# Patient Record
Sex: Female | Born: 1960 | Race: White | Hispanic: No | Smoking: Current every day smoker
Health system: Southern US, Community
[De-identification: ages and names within clinical notes are randomized; demographics above are authoritative.]

## PROBLEM LIST (undated history)

## (undated) DIAGNOSIS — F419 Anxiety disorder, unspecified: Secondary | ICD-10-CM

## (undated) DIAGNOSIS — G4730 Sleep apnea, unspecified: Secondary | ICD-10-CM

## (undated) DIAGNOSIS — F17200 Nicotine dependence, unspecified, uncomplicated: Secondary | ICD-10-CM

## (undated) DIAGNOSIS — E785 Hyperlipidemia, unspecified: Secondary | ICD-10-CM

## (undated) DIAGNOSIS — F32A Depression, unspecified: Secondary | ICD-10-CM

## (undated) DIAGNOSIS — M545 Other chronic pain: Secondary | ICD-10-CM

## (undated) DIAGNOSIS — K589 Irritable bowel syndrome without diarrhea: Secondary | ICD-10-CM

## (undated) DIAGNOSIS — K219 Gastro-esophageal reflux disease without esophagitis: Secondary | ICD-10-CM

## (undated) DIAGNOSIS — G43909 Migraine, unspecified, not intractable, without status migrainosus: Secondary | ICD-10-CM

## (undated) DIAGNOSIS — G8929 Other chronic pain: Secondary | ICD-10-CM

## (undated) DIAGNOSIS — F329 Major depressive disorder, single episode, unspecified: Secondary | ICD-10-CM

## (undated) HISTORY — DX: Other chronic pain: G89.29

## (undated) HISTORY — PX: PARTIAL HYSTERECTOMY: SHX80

## (undated) HISTORY — DX: Other chronic pain: M54.50

## (undated) HISTORY — PX: ABDOMINAL HYSTERECTOMY: SHX81

## (undated) HISTORY — DX: Low back pain: M54.5

## (undated) HISTORY — PX: TUBAL LIGATION: SHX77

---

## 1998-10-01 ENCOUNTER — Encounter: Payer: Self-pay | Admitting: Family Medicine

## 1998-10-01 ENCOUNTER — Ambulatory Visit (HOSPITAL_COMMUNITY): Admission: RE | Admit: 1998-10-01 | Discharge: 1998-10-01 | Payer: Self-pay | Admitting: Family Medicine

## 1998-11-03 ENCOUNTER — Ambulatory Visit (HOSPITAL_COMMUNITY): Admission: RE | Admit: 1998-11-03 | Discharge: 1998-11-03 | Payer: Self-pay | Admitting: Family Medicine

## 1998-11-03 ENCOUNTER — Encounter: Payer: Self-pay | Admitting: Family Medicine

## 1998-11-28 ENCOUNTER — Inpatient Hospital Stay (HOSPITAL_COMMUNITY): Admission: RE | Admit: 1998-11-28 | Discharge: 1998-11-29 | Payer: Self-pay | Admitting: Neurosurgery

## 1998-11-28 ENCOUNTER — Encounter: Payer: Self-pay | Admitting: Neurosurgery

## 1998-12-24 ENCOUNTER — Ambulatory Visit (HOSPITAL_COMMUNITY): Admission: RE | Admit: 1998-12-24 | Discharge: 1998-12-24 | Payer: Self-pay | Admitting: Neurosurgery

## 1998-12-24 ENCOUNTER — Encounter: Payer: Self-pay | Admitting: Neurosurgery

## 1999-01-28 ENCOUNTER — Ambulatory Visit (HOSPITAL_COMMUNITY): Admission: RE | Admit: 1999-01-28 | Discharge: 1999-01-28 | Payer: Self-pay | Admitting: Neurosurgery

## 1999-01-28 ENCOUNTER — Encounter: Payer: Self-pay | Admitting: Neurosurgery

## 1999-11-12 ENCOUNTER — Other Ambulatory Visit: Admission: RE | Admit: 1999-11-12 | Discharge: 1999-11-12 | Payer: Self-pay | Admitting: Family Medicine

## 2001-09-05 ENCOUNTER — Emergency Department (HOSPITAL_COMMUNITY): Admission: EM | Admit: 2001-09-05 | Discharge: 2001-09-05 | Payer: Self-pay

## 2004-02-13 ENCOUNTER — Emergency Department (HOSPITAL_COMMUNITY): Admission: AD | Admit: 2004-02-13 | Discharge: 2004-02-13 | Payer: Self-pay | Admitting: Emergency Medicine

## 2007-07-07 ENCOUNTER — Emergency Department (HOSPITAL_COMMUNITY): Admission: EM | Admit: 2007-07-07 | Discharge: 2007-07-07 | Payer: Self-pay | Admitting: Emergency Medicine

## 2007-09-02 ENCOUNTER — Emergency Department (HOSPITAL_COMMUNITY): Admission: EM | Admit: 2007-09-02 | Discharge: 2007-09-03 | Payer: Self-pay | Admitting: Emergency Medicine

## 2007-11-22 ENCOUNTER — Encounter
Admission: RE | Admit: 2007-11-22 | Discharge: 2007-11-23 | Payer: Self-pay | Admitting: Physical Medicine & Rehabilitation

## 2007-11-22 ENCOUNTER — Ambulatory Visit: Payer: Self-pay | Admitting: Physical Medicine & Rehabilitation

## 2008-01-10 ENCOUNTER — Encounter
Admission: RE | Admit: 2008-01-10 | Discharge: 2008-04-09 | Payer: Self-pay | Admitting: Physical Medicine & Rehabilitation

## 2008-01-10 ENCOUNTER — Ambulatory Visit: Payer: Self-pay | Admitting: Physical Medicine & Rehabilitation

## 2008-01-20 ENCOUNTER — Emergency Department (HOSPITAL_COMMUNITY): Admission: EM | Admit: 2008-01-20 | Discharge: 2008-01-20 | Payer: Self-pay | Admitting: Emergency Medicine

## 2008-02-22 ENCOUNTER — Ambulatory Visit: Payer: Self-pay | Admitting: Physical Medicine & Rehabilitation

## 2008-05-26 ENCOUNTER — Emergency Department (HOSPITAL_COMMUNITY): Admission: EM | Admit: 2008-05-26 | Discharge: 2008-05-27 | Payer: Self-pay | Admitting: Emergency Medicine

## 2010-01-16 ENCOUNTER — Encounter: Admission: RE | Admit: 2010-01-16 | Discharge: 2010-01-16 | Payer: Self-pay | Admitting: Family Medicine

## 2011-04-27 NOTE — Procedures (Signed)
NAMEMINAH, AXELROD               ACCOUNT NO.:  [id number]   MEDICAL RECORD NO.:  [id number]          PATIENT TYPE:  REC   LOCATION:  [facility]                          FACILITY:  [facility]   PHYSICIAN:  Erick Colace, M.D.DATE OF BIRTH:  1961/11/30   DATE OF PROCEDURE:  01/18/2008  DATE OF DISCHARGE:                               OPERATIVE REPORT   PROCEDURE:  Acupuncture treatment, number 3.   The needles were placed bilaterally at GB20, BL10, right SI11, right  SI14, right LI15, right LI11, right LI10.  4 hertz stim 30 minutes.  Also, needle placed a DU14.  The patient tolerated the procedure well.      Erick Colace, M.D.  Electronically Signed     AEK/MEDQ  D:  01/18/2008 08:41:17  T:  01/18/2008 19:16:58  Job:  [id number]   cc:   Harvie Junior, M.D.  Fax: [phone]

## 2011-04-27 NOTE — Procedures (Signed)
NAMEALMAS, RAKE               ACCOUNT NO.:  [id number]   MEDICAL RECORD NO.:  [id number]          PATIENT TYPE:  REC   LOCATION:  [facility]                          FACILITY:  [facility]   PHYSICIAN:  Erick Colace, M.D.DATE OF BIRTH:  20-Aug-1961   DATE OF PROCEDURE:  01/15/2008  DATE OF DISCHARGE:  09/03/2007                               OPERATIVE REPORT   ACUPUNCTURE TREATMENT #2:   INDICATIONS:  Cervical pain only partially responsive to medication  management and other conservative care.   Needles placed bilaterally at GB20, GB21, BL10,  right SI11,  and right  LI11,  E-Stim  4 Hz x .  The patient tolerated the procedure  well.  Post procedure instructions given.  Return later this week for  repeat treatment.  Erick Colace, M.D.  Electronically Signed     AEK/MEDQ  D:  01/15/2008 09:47:56  T:  01/15/2008 12:07:45  Job:  [id number]   cc:   Dr. Jonny Ruiz ______

## 2011-04-27 NOTE — Group Therapy Note (Signed)
Consult requested by Dr. Jodi Geralds for the evaluation of neck pain,  mid back, low back and hip pain.   CHIEF COMPLAINT:  Neck pain greater than back and hip pain, no upper  extremity pain.   Date of injury is March 20, 2007.  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  She had pain in  upper back and neck, went to [facility], was seen by physicians  there.  Shoulder x-rays are negative, MRI of the thoracic spine was  negative.  MRI of the cervical spine done at [facility] June 05, 2007 showed evidence of prior bone effusion C5-6 and moderate  degenerative disk disease spondylosis and diffuse disk bulge,  retrolisthesis 2-mm C6 on C7, mild to moderate central spinal and  biforaminal narrowing at C6-7.  Underwent C8-T1 cervical epidural  steroid injection from Dr. Regino Schultze which did not help with symptomatology.   MEDICATIONS:  She has trialed PT, has had a TENS unit, has had trigger  point injections.  She has also tried Vicodin, Lidoderm, Percocet,  Skelaxin, Lyrica, diazepam, amitriptyline, gabapentin, Ambien,  Phenergan, Darvocet, Flexeril, tramadol, Robaxin, prednisone, Fentanyl  patch and oxycodone; none of which have helped her.  She has more  recently been started on Cymbalta 30 mg a day, she is not sure whether  this helps her but she still takes it.   She has gone through an [employer], she did not get maximal effort.  She has  been rated at 1% partial permanent disability rating and reaching  maximum medical improvement and permanent restriction of 10 pounds with  constant sitting, occasional standing and walking, 8 hour days.   PAST MEDICAL HISTORY:  1. Carpal tunnel surgery 20-25 years ago.  2. Herniated disk 10-12 years ago with C5-6 fusion 1995.  3. Tubal ligation 10 years ago with partial hysterectomy 8 years ago.   SOCIAL HISTORY:  Single,  lives alone, divorced.  Smokes a pack a day.   REVIEW OF SYSTEMS:  A 14-point review.   LABORATORY DATA:  Workup was November 04, 2007, blood pressure 122/69,  pulse 89, respirations 18, O2 sat 100% room air.  GENERAL:  No acute distress, mood and affect appropriate.  NECK:  Some tenderness to palpation bilaterally, right greater than left  upper trap as well upper medial scapular border and the infraspinatus  region.  Remainder of fibromyalgia tender points were all negative.  Neck range of motion is full.  Upper extremity and lower extremity strength are normal, sensation is  normal in the upper and lower extremities.  Gait is normal, she is able  to toe walk/heel walk.  Range of motion upper and lower extremities are  normal, deep tendon reflexes are hyper-reflexic 3+ at the biceps,  triceps, brachioradialis, knee and ankle.  No evidence of clonus.  Sensation is normal.   IMPRESSION:  1. History of cervical spondylosis as well as myofascial pain      syndrome.  I believe that her current symptomatology is mainly      myofascial.  She has not responded well to narcotic analgesics or      muscle relaxers.  The only modality that has not been tried that      may be of use at this point would be acupuncture and I will ask      Worker's Comp/ to approve  at least a trial of 4 visits, twice a      week x2 weeks.  2. Will try Flector patch.  3. Continue Cymbalta.   I agree with her current restrictions as well as her disability rating  and MMI from overall healing standpoint although she may still benefit  from additional pain relief.      Erick Colace, M.D.  Electronically Signed     AEK/MedQ  D:  11/23/2007 16:39:15  T:  11/24/2007 11:38:41  Job #:  [id number]   cc:   Guy Begin  FAX: [phone]

## 2011-04-27 NOTE — Procedures (Signed)
Michele Townsend, Michele Townsend               ACCOUNT NO.:  [id number]   MEDICAL RECORD NO.:  [id number]          PATIENT TYPE:  EMS   LOCATION:  [facility]                         FACILITY:  [facility]   PHYSICIAN:  Erick Colace, M.D.DATE OF BIRTH:  Jul 15, 1961   DATE OF PROCEDURE:  DATE OF DISCHARGE:  01/20/2008                               OPERATIVE REPORT   DATE:  01/21/2007   ACUPUNCTURE TREATMENT 4 OUT OF 4:  Average pain 8 out of 10.  She has  had a flareup over the weekend, went to the ED and received muscle  relaxers and ibuprofen as well as valium.  Her pain is described as  sharp, intermittent, stabbing, tingling and aching.  It appears with  activity to be at an 7 to 8 out of 10 level.   The patient complains of right shoulder pain, neck pain as  well as some  headache pain.   The needle was placed on the right at [facility] as in large intestine.  15 Lis  in large intestine.  14 in Lis in large intestine, 4 for her spim  between LI15 and LI4.  Also a needle was placed at right SI11, right  SI14 with E-stim at 4 Hz times 30 minutes and then between BU14 and DU20  for her stimulation times 20 minutes.  Also a needle was placed at BL10  and GB20.  The patient tolerated the procedure well.  Will return in one  week for followup appointment to assess efficacy if noted some  improvements over the following discussed trial treatment.  Would  schedule additional A treatments.  If not, will discuss other treatment  options.      Erick Colace, M.D.  Electronically Signed     AEK/MEDQ  D:  01/22/2008 45:40:98  T:  01/22/2008 17:39:53  Job:  [id number]   cc:   Harvie Junior, M.D.  Fax: [phone]

## 2011-04-27 NOTE — Procedures (Signed)
NAMEEMBERLYN, BURLISON               ACCOUNT NO.:  [id number]   MEDICAL RECORD NO.:  [id number]          PATIENT TYPE:  REC   LOCATION:  [facility]                          FACILITY:  [facility]   PHYSICIAN:  Erick Colace, M.D.DATE OF BIRTH:  [birth year]   DATE OF PROCEDURE:  DATE OF DISCHARGE:                               OPERATIVE REPORT   TREATMENT:  Acupuncture treatment today consisted of needles placed at  GB-20, GB-21, BL-10 on the right side.  Right SI-11, right TH-14, right  LI-11 at 4 Hz stim x30 minutes.   Patient tolerated the procedure well.  Return in one week.      Erick Colace, M.D.  Electronically Signed     AEK/MEDQ  D:  01/11/2008 17:23:57  T:  01/12/2008 09:15:40  Job:  [id number]   cc:   Harvie Junior, M.D.  Fax: [phone]   Weldon Inches

## 2011-04-27 NOTE — Assessment & Plan Note (Signed)
A 50 year old female with date of injury March 20, 2007, working as Veterinary surgeon.  Several boxes fell from shelf onto right posterior  shoulder and neck area, pain in the upper back and neck.  Shoulder x-  rays negative, MRI thoracic spine negative, MRI of cervical spine showed  evidence of prior bone fusion C5-6, moderate degenerative discs,  spondylosis, diffuse disc bulge, 2 mm retrolisthesis C6 on 7, mild to  moderate central canal and biforaminal narrowing C6 to 7.  Underwent C8  T1 cervical epidural which was not helpful.  She has trialed physical  therapy including TENS unit and has had trigger point injections.  She  has tried Vicodin and Lidoderm approach.  She has had Skelaxin, Lyrica,  diazepam, amitriptyline, gabapentin, Ambien, Phenergan, Darvocet,  Flexeril, tramadol, Robaxin and prednisone, fentanyl patch and  oxycodone, none of which have been helpful.  She has tried Cymbalta  starting at 30 mg a day then going up to 60 but she is not sure whether  this has helped her and, in fact, does not think it has helped her.   She has gone through an [facility], no maximal effort, rated at 1% partial  permanent disability rating, reaching maximal medical improvement,  permanent restriction 10 pound with constant sitting, occasional  standing and walking 8 hour days.   CURRENT COMPLAINTS:  Ongoing neck pain, right upper extremity pain.  Right upper extremity pain is burning pain, tingling.  Neck is more  stabbing and constant.  Average pain is 8 out 10, pain with activity is  7 out of 10, sleep is poor.  Pain improves with rest, heat.  Relief from  meds is none.  She walks without assistance.  She walks 30 minutes at a  time.  She drives.  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  She has some numbness and  tingling right upper extremity mainly in the index finger, trouble  walking, spasms, depression, anxiety, no suicidal thoughts.   Her exercise basically consists of walking a lot at work.  She does some  neck range of motion exercises.  Her blood pressure is 155/78, pulse 84,  respiratory rate is 18, O2 sat 98% on room air.  GENERAL:  No acute distress.  MOOD AND AFFECT:  Appropriate to orientation x3.  She has a flat affect  but no emotional lability or agitation.  GAIT:  Normal.  She is able to toe walk, heel walk.  NECK:  Range of motion in the 75% range forward flexion, extension,  lateral rotation and bending.  She has hypersensitivity even with light  palpation in the upper trapezius on the right side.  Her neck range of  motion is full.  UPPER AND LOWER EXTREMITY STRENGTH:  Normal.  5/5 in the deltoid,  biceps, triceps, grip, hip flexion, knee extension, ankle dorsiflexor.  Deep tendon reflexes are 3+ at the biceps, triceps, brachial radialis,  knee and ankle.  No obvious clonus.  Sensation is only mildly reduced  right index finger.  Her left side reverse phalanx is negative.   IMPRESSION:  History of cervical spondylosis as well as cervical  myofascial pain syndrome.  I believe that her current symptomatology is  mainly myofascial.  She has not responded to narcotic analgesics or  muscle relaxers or atypical anticonvulsants for neuropathic component of  pain.  She has been trialed on acupuncture which did not result in any  improvement.  She has tried TEFL teacher as well as Cymbalta which has  also not been helpful.   PLAN:  I think that she is basically at a stable plateau.  She is  functional in her activities of daily living and is able to work with  her restrictions at [facility].  I do not think further diagnostic or  therapeutic procedures are needed at this time.  She has been trialed on  just about every medicine conceivable for this condition and has not  responded.  I agree with her previous partial permanent disability  rating and restrictions.  I will see her back on a p.r.n. basis.  We   discussed usage of over-the-counter medications, which she has tried  ibuprofen with some success 800 mg b.i.d.  with food.  I offered alternative of Aleve 2 to 3 tablets twice a day  with food.  She should not mix these together but can take Tylenol on  top of these medications as needed.      Erick Colace, M.D.  Electronically Signed     AEK/MedQ  D:  01/30/2008 09:12:05  T:  01/30/2008 18:00:38  Job #:  [id number]   cc:   Luciano Cutter  [facility]  Claim #[id number]  Fax: #: [phone]

## 2011-04-27 NOTE — Assessment & Plan Note (Signed)
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 Pain  in the upper back and neck. Shoulder x-ray is negative. MRI of the  thoracic spine negative. MRI C-spine showed prior bone fusion C5-6,  moderate degenerative disk, spondylosis, diffuse disk bulge, mild to  moderate central canal and bi-foraminal narrowing at C6-7. Underwent C8-  T1 epidural, which was not helped with trial of physical therapy  including Tens unit and trigger point injections. She has gone through  and [employer]. Rated at a 1% partial permanent disability rating. Reaching  maximal medical improvement with permanent 10 pound restriction,  constant sitting, occasional standing and walking 8 hour days. She has  been trialed on Vicodin, Darvocet, Tramadol, Fentanyl patch, Oxycodone  in terms of narcotic analgesics, none of which have been helpful.  Skelaxin, Diazepam, Flexeril, Robaxin in terms of muscle relaxer's.  Lyrica, amitriptyline, Gabapentin and Cymbalta for neuropathic pain  medications, none of which have been helpful. She had an Acupuncture  trial, which similarly was not helpful after more treatment. I last saw  her on January 30, 2008. I felt that she was a stable plateau at that  time. She has been functional with ADL's. Able to work with her  restrictions at [employer]. She returns today saying that her neck hurts  when she has to turn her head frequently, when greeting people at the  [employer].   REVIEW OF SYSTEMS:  She can walk 30 minutes at a time, she drives. She  climbs steps but limited in this. Her average pain is 8 out of 10, which  is currently and that was her initial pain level when I saw her on  November 23, 2007 and has been in the 8 out of 10 range on an average  since. She has had no falls, no new injuries, no new medical condition.   SOCIAL HISTORY:  She is requesting that I limit her work activities,  either by reducing her hours or reducing the number of days per week  that she works. However, upon further questioning, she does not have any  particular hour during the work day that her pain limits her more,  similarly no other pattern in terms of what day of the week her pain  seems to be worse.   PHYSICAL EXAMINATION:  VITAL SIGNS:  Blood pressure 131/79, pulse 92,  respiratory rate 18, O2 sat 99% on room air.  GENERAL:  No acute distress. Mood and affect is appropriate, although  mildly irritable.  NEUROLOGIC:  Her gait is normal. No toe drag or knee instability here.  Cervical range of motion  is 75% on forward flexion, extension lateral  rotation and bending. She has hypersensitivity to light palpation upper  trapezius, right greater than left side. Her lumbar range of motion  is  normal. Her upper extremity strength is 5/5 deltoid, biceps, and triceps  grip with 5/5 hip flexion, knee extension and ankle dorsiflexion. Deep  tendon reflexes 3+ in the biceps, triceps, brachial radialis knee and  ankle. No clonus. Sensation is equal bilateral upper extremities to 10.   IMPRESSION:  History of cervical spondylosis as well as cervical  myofascial pain syndrome. Recurrent symptomatology is mainly myofascial.  She has not responded to narcotic analgesics, muscle relaxer's, or  atypical anticonvulsants. She has tried physical therapy and  Acupuncture,  also not helpful. Once again, I think she is stable. She  has done well with over-the-counter medicines, as she has with stronger  narcotic analgesics.   RECOMMENDATIONS:  Continue Ibuprofen up to 800 mg b.i.d. as needed  during work hours. In terms of cutting her work hours or reducing the  number of days she works, I am not confident that that would in fact  result in less pain for her. As I indicate to the patient and discuss  with her, there is no clear pattern to  her complaints and no way to  objectively impose additional restrictions. I do think that the current  restrictions are appropriate. I have indicated that she may substitute  neck rotation with truncal rotation, as many people do after cervical  spinal fusion.   I will see the patient back on a p.r.n. basis. She is disappointed that  no additional restrictions have been placed.      Erick Colace, M.D.  Electronically Signed     AEK/MedQ  D:  02/23/2008 16:57:35  T:  02/24/2008 10:11:44  Job #:  [id number]   cc:   Harvie Junior, M.D.  Fax: [phone]

## 2012-10-14 ENCOUNTER — Emergency Department (HOSPITAL_COMMUNITY): Payer: Self-pay

## 2012-10-14 ENCOUNTER — Emergency Department (HOSPITAL_COMMUNITY)
Admission: EM | Admit: 2012-10-14 | Discharge: 2012-10-14 | Disposition: A | Discharge status: Home / Self Care | Payer: Self-pay | Attending: Emergency Medicine | Admitting: Emergency Medicine

## 2012-10-14 ENCOUNTER — Encounter (HOSPITAL_COMMUNITY): Payer: Self-pay | Admitting: Emergency Medicine

## 2012-10-14 DIAGNOSIS — R109 Unspecified abdominal pain: Secondary | ICD-10-CM | POA: Insufficient documentation

## 2012-10-14 DIAGNOSIS — F17200 Nicotine dependence, unspecified, uncomplicated: Secondary | ICD-10-CM | POA: Insufficient documentation

## 2012-10-14 DIAGNOSIS — R110 Nausea: Secondary | ICD-10-CM | POA: Insufficient documentation

## 2012-10-14 LAB — COMPREHENSIVE METABOLIC PANEL
ALT: 8 U/L (ref 0–35)
CO2: 24 mEq/L (ref 19–32)
Calcium: 10.1 mg/dL (ref 8.4–10.5)
Creatinine, Ser: 0.79 mg/dL (ref 0.50–1.10)
GFR calc Af Amer: >90 mL/min (ref >90)
GFR calc non Af Amer: >90 mL/min (ref >90)
Glucose, Bld: 96 mg/dL (ref 70–99)
Sodium: 141 mEq/L (ref 135–145)
Total Protein: 6.9 g/dL (ref 6.0–8.3)

## 2012-10-14 LAB — URINALYSIS, MICROSCOPIC ONLY
Leukocytes, UA: NEGATIVE
Nitrite: NEGATIVE
Protein, ur: NEGATIVE mg/dL
Specific Gravity, Urine: 1.018 (ref 1.005–1.030)
Urobilinogen, UA: 0.2 mg/dL (ref 0.0–1.0)

## 2012-10-14 LAB — CBC WITH DIFFERENTIAL/PLATELET
Eosinophils Absolute: 0.2 10*3/uL (ref 0.0–0.7)
Eosinophils Relative: 2 % (ref 0–5)
HCT: 45.4 % (ref 36.0–46.0)
Lymphs Abs: 2.3 10*3/uL (ref 0.7–4.0)
MCH: 33.8 pg (ref 26.0–34.0)
MCV: 96.6 fL (ref 78.0–100.0)
Monocytes Absolute: 0.7 10*3/uL (ref 0.1–1.0)
Platelets: 206 10*3/uL (ref 150–400)
RBC: 4.7 MIL/uL (ref 3.87–5.11)
RDW: 12.7 % (ref 11.5–15.5)

## 2012-10-14 LAB — LIPASE, BLOOD: Lipase: 19 U/L (ref 11–59)

## 2012-10-14 MED ORDER — HYDROMORPHONE HCL PF 1 MG/ML IJ SOLN
1.0000 mg | Freq: Once | INTRAMUSCULAR | Status: AC
  Administered 2012-10-14: 1 mg via INTRAVENOUS
  Filled 2012-10-14: qty 1

## 2012-10-14 MED ORDER — ONDANSETRON HCL 4 MG/2ML IJ SOLN
4.0000 mg | Freq: Once | INTRAMUSCULAR | Status: AC
  Administered 2012-10-14: 4 mg via INTRAVENOUS
  Filled 2012-10-14: qty 2

## 2012-10-14 MED ORDER — SODIUM CHLORIDE 0.9 % IV BOLUS (SEPSIS)
500.0000 mL | Freq: Once | INTRAVENOUS | Status: AC
  Administered 2012-10-14: 13:00:00 via INTRAVENOUS

## 2012-10-14 MED ORDER — IOHEXOL 300 MG/ML  SOLN
20.0000 mL | INTRAMUSCULAR | Status: AC
  Administered 2012-10-14 (×2): 20 mL via ORAL

## 2012-10-14 MED ORDER — IOHEXOL 300 MG/ML  SOLN
100.0000 mL | Freq: Once | INTRAMUSCULAR | Status: AC | PRN
  Administered 2012-10-14: 100 mL via INTRAVENOUS

## 2012-10-14 MED ORDER — OXYCODONE-ACETAMINOPHEN 5-325 MG PO TABS
1.0000 | ORAL_TABLET | Freq: Once | ORAL | Status: AC
  Administered 2012-10-14: 1 via ORAL
  Filled 2012-10-14: qty 1

## 2012-10-14 MED ORDER — OXYCODONE-ACETAMINOPHEN 5-325 MG PO TABS: 1.0000 | ORAL_TABLET | Freq: Four times a day (QID) | ORAL | Status: DC | PRN

## 2012-10-14 NOTE — ED Notes (Signed)
Family at bedside. 

## 2012-10-14 NOTE — ED Provider Notes (Signed)
History     CSN: [id number]  Arrival date & time 10/14/12  1219   First MD Initiated Contact with Patient 10/14/12 1304      Chief Complaint  Patient presents with  . Abdominal Pain    (Consider location/radiation/quality/duration/timing/severity/associated sxs/prior treatment) Patient is a 51 y.o. female presenting with abdominal pain. The history is provided by the patient.  Abdominal Pain The primary symptoms of the illness include abdominal pain and nausea. The primary symptoms of the illness do not include fever, shortness of breath, dysuria, vaginal discharge or vaginal bleeding.  Symptoms associated with the illness do not include chills or back pain.  pt c/o rlq, right mid abd pain for the past 2-3 days. Constant, but periods where pain is worse. Also states pain seems to be getting worse. No specific exacerbating or alleviating factors. No relation to eating. Decreased appetite, nausea. No vomiting or diarrhea. No constipation. Denies fever or chills. No hx same pain in past. No hx kidney stones, gallstones, diverticula, or appendicitis. Prior abd surgery includes hysterectomy and tubal ligation. No dysuria, urgency. No back/flank pain. No trauma to abdomen. No cough or uri c/o. No cp.     History reviewed. No pertinent past medical history.  Past Surgical History  Procedure Date  . Tubal ligation   . Partial hysterectomy     No family history on file.  History  Substance Use Topics  . Smoking status: Current Every Day Smoker -- 0.5 packs/day    Types: Cigarettes  . Smokeless tobacco: Not on file  . Alcohol Use: No    OB History    Grav Para Term Preterm Abortions TAB SAB Ect Mult Living                  Review of Systems  Constitutional: Negative for fever and chills.  HENT: Negative for neck pain.   Eyes: Negative for redness.  Respiratory: Negative for cough and shortness of breath.   Cardiovascular: Negative for chest pain.  Gastrointestinal: Positive  for nausea and abdominal pain.  Genitourinary: Negative for dysuria, flank pain, vaginal bleeding and vaginal discharge.  Musculoskeletal: Negative for back pain.  Skin: Negative for rash.  Neurological: Negative for headaches.  Hematological: Does not bruise/bleed easily.  Psychiatric/Behavioral: Negative for confusion.    Allergies  Review of patient's allergies indicates no known allergies.  Home Medications   Current Outpatient Rx  Name Route Sig Dispense Refill  . ACETAMINOPHEN 500 MG PO TABS Oral Take 1,000 mg by mouth every 6 (six) hours as needed. For pain    . ALPRAZOLAM 0.5 MG PO TABS Oral Take 0.5 mg by mouth 2 (two) times daily as needed. For anxiety    . BUPROPION HCL ER (XL) 150 MG PO TB24 Oral Take 150 mg by mouth every morning.    Marland Kitchen ESTRADIOL 1 MG PO TABS Oral Take 1 mg by mouth daily.    . IBUPROFEN 200 MG PO TABS Oral Take 400 mg by mouth every 6 (six) hours as needed. For pain    . VENLAFAXINE HCL ER 75 MG PO CP24 Oral Take 75 mg by mouth every morning.      BP 115/67  Pulse 108  Temp 98.5 F (36.9 C) (Oral)  Resp 18  Ht 5\' 4"  (1.626 m)  Wt 200 lb (90.719 kg)  BMI 34.33 kg/m2  SpO2 98%  Physical Exam  Nursing note and vitals reviewed. Constitutional: She appears well-developed and well-nourished. No distress.  Eyes: Conjunctivae normal  are normal. No scleral icterus.  Neck: Neck supple. No tracheal deviation present.  Cardiovascular: Normal rate, regular rhythm, normal heart sounds and intact distal pulses.   Pulmonary/Chest: Effort normal and breath sounds normal. No respiratory distress.  Abdominal: Soft. Normal appearance and bowel sounds are normal. She exhibits no distension and no mass. There is tenderness. There is no rebound and no guarding.       Right mid to lower abdominal tenderness.   Genitourinary:       No cva tenderness  Musculoskeletal: She exhibits no edema and no tenderness.  Neurological: She is alert.  Skin: Skin is warm and dry.  No rash noted.       No skin changes, shingles or rash in area of pain  Psychiatric: She has a normal mood and affect.    ED Course  Procedures (including critical care time)  Labs Reviewed  CBC WITH DIFFERENTIAL - Abnormal; Notable for the following:    Hemoglobin 15.9 (*)     All other components within normal limits  COMPREHENSIVE METABOLIC PANEL  LIPASE, BLOOD  URINALYSIS, MICROSCOPIC ONLY  URINALYSIS, ROUTINE W REFLEX MICROSCOPIC   Results for orders placed during the hospital encounter of 10/14/12  CBC WITH DIFFERENTIAL      Component Value Range   WBC 10.1  4.0 - 10.5 K/uL   RBC 4.70  3.87 - 5.11 MIL/uL   Hemoglobin 15.9 (*) 12.0 - 15.0 g/dL   HCT 96.0  45.4 - 09.8 %   MCV 96.6  78.0 - 100.0 fL   MCH 33.8  26.0 - 34.0 pg   MCHC 35.0  30.0 - 36.0 g/dL   RDW 11.9  14.7 - 82.9 %   Platelets 206  150 - 400 K/uL   Neutrophils Relative 69  43 - 77 %   Neutro Abs 7.0  1.7 - 7.7 K/uL   Lymphocytes Relative 23  12 - 46 %   Lymphs Abs 2.3  0.7 - 4.0 K/uL   Monocytes Relative 7  3 - 12 %   Monocytes Absolute 0.7  0.1 - 1.0 K/uL   Eosinophils Relative 2  0 - 5 %   Eosinophils Absolute 0.2  0.0 - 0.7 K/uL   Basophils Relative 0  0 - 1 %   Basophils Absolute 0.0  0.0 - 0.1 K/uL  COMPREHENSIVE METABOLIC PANEL      Component Value Range   Sodium 141  135 - 145 mEq/L   Potassium 3.9  3.5 - 5.1 mEq/L   Chloride 108  96 - 112 mEq/L   CO2 24  19 - 32 mEq/L   Glucose, Bld 96  70 - 99 mg/dL   BUN 14  6 - 23 mg/dL   Creatinine, Ser 5.62  0.50 - 1.10 mg/dL   Calcium 13.0  8.4 - 86.5 mg/dL   Total Protein 6.9  6.0 - 8.3 g/dL   Albumin 3.7  3.5 - 5.2 g/dL   AST 14  0 - 37 U/L   ALT 8  0 - 35 U/L   Alkaline Phosphatase 84  39 - 117 U/L   Total Bilirubin 0.2 (*) 0.3 - 1.2 mg/dL   GFR calc non Af Amer >90  >90 mL/min   GFR calc Af Amer >90  >90 mL/min  LIPASE, BLOOD      Component Value Range   Lipase 19  11 - 59 U/L  URINALYSIS, MICROSCOPIC ONLY      Component Value Range  Color, Urine YELLOW  YELLOW   APPearance CLEAR  CLEAR   Specific Gravity, Urine 1.018  1.005 - 1.030   pH 7.0  5.0 - 8.0   Glucose, UA NEGATIVE  NEGATIVE mg/dL   Hgb urine dipstick TRACE (*) NEGATIVE   Bilirubin Urine NEGATIVE  NEGATIVE   Ketones, ur NEGATIVE  NEGATIVE mg/dL   Protein, ur NEGATIVE  NEGATIVE mg/dL   Urobilinogen, UA 0.2  0.0 - 1.0 mg/dL   Nitrite NEGATIVE  NEGATIVE   Leukocytes, UA NEGATIVE  NEGATIVE   RBC / HPF 0-2  <3 RBC/hpf   Squamous Epithelial / LPF RARE  RARE   Casts GRANULAR CAST (*) NEGATIVE     MDM  Iv ns bolus. Dilaudid 1 mg iv. zofran iv. Labs. Ct.   Reviewed nursing notes and prior charts for additional history.   Ct pending. Pt moved to cdu.  Discussed w cdu np, Adaline Sill, to check ct results when back.  If normal/negative may d/c. If positive, tx appropriately.  Also discussed w Dr Bebe Shaggy, tx plan.          Suzi Roots, MD 10/14/12 [phone]

## 2012-10-14 NOTE — ED Notes (Signed)
Pt reports having RLQ pain for a few days a/w nausea; denies dysuria, vomiting, diarrhea; pt tender on palpation

## 2012-12-03 ENCOUNTER — Encounter (HOSPITAL_COMMUNITY): Payer: Self-pay | Admitting: *Deleted

## 2012-12-03 ENCOUNTER — Emergency Department (HOSPITAL_COMMUNITY)
Admission: EM | Admit: 2012-12-03 | Discharge: 2012-12-03 | Disposition: A | Discharge status: Home / Self Care | Payer: Self-pay | Attending: Emergency Medicine | Admitting: Emergency Medicine

## 2012-12-03 DIAGNOSIS — K047 Periapical abscess without sinus: Secondary | ICD-10-CM | POA: Insufficient documentation

## 2012-12-03 DIAGNOSIS — R51 Headache: Secondary | ICD-10-CM | POA: Insufficient documentation

## 2012-12-03 DIAGNOSIS — Z79899 Other long term (current) drug therapy: Secondary | ICD-10-CM | POA: Insufficient documentation

## 2012-12-03 DIAGNOSIS — F17200 Nicotine dependence, unspecified, uncomplicated: Secondary | ICD-10-CM | POA: Insufficient documentation

## 2012-12-03 MED ORDER — HYDROCODONE-ACETAMINOPHEN 5-325 MG PO TABS
2.0000 | ORAL_TABLET | Freq: Once | ORAL | Status: AC
  Administered 2012-12-03: 2 via ORAL
  Filled 2012-12-03: qty 2

## 2012-12-03 MED ORDER — IBUPROFEN 600 MG PO TABS: 600.0000 mg | ORAL_TABLET | Freq: Four times a day (QID) | ORAL | Status: DC | PRN

## 2012-12-03 MED ORDER — PENICILLIN V POTASSIUM 500 MG PO TABS: 500.0000 mg | ORAL_TABLET | Freq: Three times a day (TID) | ORAL | Status: DC

## 2012-12-03 MED ORDER — OXYCODONE-ACETAMINOPHEN 7.5-500 MG PO TABS: 1.0000 | ORAL_TABLET | ORAL | Status: DC | PRN

## 2012-12-03 NOTE — ED Provider Notes (Signed)
History     CSN: [id number]  Arrival date & time 12/03/12  1232   First MD Initiated Contact with Patient 12/03/12 1241      Chief Complaint  Patient presents with  . Dental Pain  . Headache     HPI Pt is here with left upper tooth pain that has been increasing over the last week and now with bad headache and pt is crying. No chest pain  History reviewed. No pertinent past medical history.  Past Surgical History  Procedure Date  . Tubal ligation   . Partial hysterectomy   . Abdominal hysterectomy     No family history on file.  History  Substance Use Topics  . Smoking status: Current Every Day Smoker -- 0.5 packs/day    Types: Cigarettes  . Smokeless tobacco: Not on file  . Alcohol Use: No    OB History    Grav Para Term Preterm Abortions TAB SAB Ect Mult Living                  Review of Systems All other systems reviewed and are negative Allergies  Review of patient's allergies indicates no known allergies.  Home Medications   Current Outpatient Rx  Name  Route  Sig  Dispense  Refill  . ACETAMINOPHEN 500 MG PO TABS   Oral   Take 1,000 mg by mouth every 6 (six) hours as needed. For pain         . ALPRAZOLAM 0.5 MG PO TABS   Oral   Take 0.5 mg by mouth 2 (two) times daily as needed. For anxiety         . BUPROPION HCL ER (XL) 150 MG PO TB24   Oral   Take 150 mg by mouth every morning.         Marland Kitchen ESTRADIOL 1 MG PO TABS   Oral   Take 1 mg by mouth daily.         . IBUPROFEN 600 MG PO TABS   Oral   Take 1 tablet (600 mg total) by mouth every 6 (six) hours as needed for pain.   30 tablet   0   . OXYCODONE-ACETAMINOPHEN 7.5-500 MG PO TABS   Oral   Take 1 tablet by mouth every 4 (four) hours as needed for pain.   30 tablet   0   . PENICILLIN V POTASSIUM 500 MG PO TABS   Oral   Take 1 tablet (500 mg total) by mouth 3 (three) times daily.   30 tablet   0   . VENLAFAXINE HCL ER 75 MG PO CP24   Oral   Take 75 mg by mouth every  morning.           BP 106/70  Pulse 78  Temp 97.4 F (36.3 C) (Oral)  Resp 22  SpO2 100%  Physical Exam  Nursing note and vitals reviewed. Constitutional: She is oriented to person, place, and time. She appears well-developed and well-nourished. No distress.  HENT:  Head: Normocephalic and atraumatic.  Mouth/Throat:    Eyes: Pupils are equal, round, and reactive to light.  Neck: Normal range of motion.  Cardiovascular: Normal rate and intact distal pulses.   Pulmonary/Chest: No respiratory distress.  Abdominal: Normal appearance. She exhibits no distension.  Musculoskeletal: Normal range of motion.  Neurological: She is alert and oriented to person, place, and time. No cranial nerve deficit.  Skin: Skin is warm and dry. No rash noted.  Psychiatric:  She has a normal mood and affect. Her behavior is normal.    ED Course  Procedures (including critical care time)  Labs Reviewed - No data to display No results found.   1. Abscessed tooth       MDM         Nelia Shi, MD 12/03/12 1351

## 2012-12-03 NOTE — ED Notes (Signed)
Pt is here with left upper tooth pain that has been increasing over the last week and now with bad headache and pt is crying.  No chest pain

## 2013-04-04 ENCOUNTER — Emergency Department (HOSPITAL_COMMUNITY)
Admission: EM | Admit: 2013-04-04 | Discharge: 2013-04-04 | Disposition: A | Discharge status: Home / Self Care | Payer: Self-pay | Attending: Emergency Medicine | Admitting: Emergency Medicine

## 2013-04-04 ENCOUNTER — Emergency Department (HOSPITAL_COMMUNITY): Payer: Self-pay

## 2013-04-04 ENCOUNTER — Encounter (HOSPITAL_COMMUNITY): Payer: Self-pay | Admitting: Emergency Medicine

## 2013-04-04 DIAGNOSIS — R110 Nausea: Secondary | ICD-10-CM | POA: Insufficient documentation

## 2013-04-04 DIAGNOSIS — F17200 Nicotine dependence, unspecified, uncomplicated: Secondary | ICD-10-CM | POA: Insufficient documentation

## 2013-04-04 DIAGNOSIS — R51 Headache: Secondary | ICD-10-CM | POA: Insufficient documentation

## 2013-04-04 DIAGNOSIS — R42 Dizziness and giddiness: Secondary | ICD-10-CM | POA: Insufficient documentation

## 2013-04-04 DIAGNOSIS — Z79899 Other long term (current) drug therapy: Secondary | ICD-10-CM | POA: Insufficient documentation

## 2013-04-04 MED ORDER — ONDANSETRON HCL 4 MG/2ML IJ SOLN
INTRAMUSCULAR | Status: AC
  Administered 2013-04-04: 4 mg
  Filled 2013-04-04: qty 2

## 2013-04-04 MED ORDER — HYDROMORPHONE HCL PF 1 MG/ML IJ SOLN
1.0000 mg | Freq: Once | INTRAMUSCULAR | Status: AC
  Administered 2013-04-04: 1 mg via INTRAVENOUS
  Filled 2013-04-04: qty 1

## 2013-04-04 MED ORDER — ONDANSETRON 4 MG PO TBDP
4.0000 mg | ORAL_TABLET | Freq: Once | ORAL | Status: DC
  Filled 2013-04-04: qty 1

## 2013-04-04 MED ORDER — METOCLOPRAMIDE HCL 5 MG/ML IJ SOLN
10.0000 mg | INTRAMUSCULAR | Status: AC
  Administered 2013-04-04: 10 mg via INTRAVENOUS
  Filled 2013-04-04: qty 2

## 2013-04-04 MED ORDER — HYDROCODONE-ACETAMINOPHEN 5-325 MG PO TABS: ORAL_TABLET | ORAL | Status: DC

## 2013-04-04 NOTE — ED Notes (Signed)
Pt here for HA x months not getting better after having teeth pulled

## 2013-04-04 NOTE — ED Provider Notes (Signed)
Medical screening examination/treatment/procedure(s) were conducted as a shared visit with non-physician practitioner(s) and myself.  I personally evaluated the patient during the encounter  Doug Sou, MD 04/04/13 [phone]

## 2013-04-04 NOTE — ED Provider Notes (Signed)
Complains of right-sided occipitoparietal headache onset approximately 2 days ago with headache is throbbing accompanied by nausea. On exam alert nontoxic Glasgow Coma Score 15 HEENT exam no facial asymmetry number and moist neck supple no signs of meningitis neurologic Glasgow Coma Score 15 motor strength 5 over 5 overall gait normal Romberg normal pronator drift normal  Doug Sou, MD 04/04/13 1404

## 2013-04-04 NOTE — ED Provider Notes (Signed)
History     CSN: [id number]  Arrival date & time 04/04/13  1117   First MD Initiated Contact with Patient 04/04/13 1304      Chief Complaint  Patient presents with  . Headache    (Consider location/radiation/quality/duration/timing/severity/associated sxs/prior treatment) Patient is a 52 y.o. female presenting with headaches. The history is provided by the patient. No language interpreter was used.  Headache Associated symptoms: dizziness and nausea   Associated symptoms: no diarrhea, no fatigue, no fever, no neck pain, no neck stiffness, no numbness and no vomiting   Pt is a 52yo female c/o moderate to severe headache x21mo, sharp in quality located at base of occipital bone, does not radiate.  Pt also reports feeling dizzy and nauseated.  States symptoms started after having all her teeth removed but was concerned with pain did not go away after a few weeks.  States severe pain started over last 2 days. Nothing makes the headache better or worse.  Denies heart, lung, or abdominal problems.  States she is on as needed anxiety medication.  Pt reports family history of severe headaches.  PCP is Corky Mull PA  History reviewed. No pertinent past medical history.  Past Surgical History  Procedure Laterality Date  . Tubal ligation    . Partial hysterectomy    . Abdominal hysterectomy      History reviewed. No pertinent family history.  History  Substance Use Topics  . Smoking status: Current Every Day Smoker -- 0.50 packs/day    Types: Cigarettes  . Smokeless tobacco: Not on file  . Alcohol Use: No    OB History   Grav Para Term Preterm Abortions TAB SAB Ect Mult Living                  Review of Systems  Constitutional: Negative for fever, chills and fatigue.  HENT: Negative for neck pain and neck stiffness.   Respiratory: Negative for chest tightness and shortness of breath.   Cardiovascular: Negative for chest pain.  Gastrointestinal: Positive for nausea. Negative  for vomiting and diarrhea.  Neurological: Positive for dizziness and headaches. Negative for syncope, speech difficulty, weakness, light-headedness and numbness.    Allergies  Review of patient's allergies indicates no known allergies.  Home Medications   Current Outpatient Rx  Name  Route  Sig  Dispense  Refill  . ALPRAZolam (XANAX) 0.5 MG tablet   Oral   Take 0.5 mg by mouth 2 (two) times daily as needed. For anxiety         . buPROPion (WELLBUTRIN XL) 150 MG 24 hr tablet   Oral   Take 150 mg by mouth every morning.         Marland Kitchen estradiol (ESTRACE) 1 MG tablet   Oral   Take 1 mg by mouth daily.         Marland Kitchen venlafaxine XR (EFFEXOR-XR) 75 MG 24 hr capsule   Oral   Take 75 mg by mouth every morning.         Marland Kitchen HYDROcodone-acetaminophen (NORCO/VICODIN) 5-325 MG per tablet      Take 1 to 2 tabs every 4-6hrs as needed for pain   10 tablet   0     BP 151/85  Pulse 85  Temp(Src) 97.9 F (36.6 C) (Oral)  Resp 18  SpO2 100%  Physical Exam  Nursing note and vitals reviewed. Constitutional: She is oriented to person, place, and time. She appears well-developed and well-nourished. No distress.  HENT:  Head: Normocephalic and atraumatic.  Mouth/Throat: No oropharyngeal exudate.  Eyes: Conjunctivae are normal. No scleral icterus.  Neck: Normal range of motion. Neck supple.  No midline tenderness, FROM, no step offs or crepitus   Cardiovascular: Normal rate, regular rhythm and normal heart sounds.   Pulmonary/Chest: Effort normal and breath sounds normal. No respiratory distress. She has no wheezes. She has no rales. She exhibits no tenderness.  Abdominal: Soft. Bowel sounds are normal. She exhibits no distension. There is no tenderness.  Musculoskeletal: Normal range of motion. She exhibits tenderness ( over base of occipital bone. No mass, lesion or erythema.  Skin in tact. ).  Neurological: She is alert and oriented to person, place, and time. She has normal strength  and normal reflexes. She displays normal reflexes. No cranial nerve deficit. She exhibits normal muscle tone. She displays a negative Romberg sign. Coordination normal.  CN II-XII in tact, nl coordination, plantar and dorsiflexion in tact, neg romberg, nl gait  Skin: Skin is warm and dry. She is not diaphoretic.    ED Course  Procedures (including critical care time)  Labs Reviewed - No data to display Ct Head Wo Contrast  04/04/2013  *RADIOLOGY REPORT*  Clinical Data: Posterior right headache.  CT HEAD WITHOUT CONTRAST  Technique:  Contiguous axial images were obtained from the base of the skull through the vertex without contrast.  Comparison: Head CT scan 07/07/2007.  Findings: There is no evidence of acute intracranial abnormality including infarct, hemorrhage, mass lesion, mass effect, midline shift or abnormal extra-axial fluid collection.  No hydrocephalus or pneumocephalus.  Calvarium intact.  IMPRESSION: Negative exam.   Original Report Authenticated By: Holley Dexter, M.D.      1. Headache       MDM  Pt is a 52yo female c/o severe posterior headache x85mo, with worsening headache over past 2 days.  Constant sharp pain, no radiation.  C/o nausea and dizziness.  Denies fever, vomiting, diarrhea.  Similar HA. FH of "severe" headaches.  Neuro exam: CN II-XII in tact.  Neg romberg. Nl gait. Nl coordination, strength and sensation.  CT head: neg.   Not concerned for intracranial bleed.  Pt denies CA or IVDU.    Tx: diluadid and zofran.  Pt still in pain after head CT.  Tx: reglan.   4:28 PM Pt states she feels somewhat better.  Eating sandwich and sipping water at this time.  Explained to pt that CT scan was nl.  Unfortunately we do not have any answers for her today for cause of her headache but encouraged her to speak with her PCP for ongoing headache management.     Rx: norco and f/u with PCP-Redman PA-C at [facility] as needed for continued headache management.     Return to ED if loss of balance, develop fever.  Vitals: unremarkable. Discharged in stable condition.    Discussed pt with attending during ED encounter.        Junius Finner, PA-C 04/04/13 1629

## 2013-04-04 NOTE — ED Notes (Signed)
Pt states that she has sinus pressure as well.

## 2014-06-27 ENCOUNTER — Other Ambulatory Visit: Payer: Self-pay | Admitting: Physician Assistant

## 2014-06-27 DIAGNOSIS — Z1231 Encounter for screening mammogram for malignant neoplasm of breast: Secondary | ICD-10-CM

## 2014-07-09 ENCOUNTER — Ambulatory Visit: Payer: Self-pay

## 2014-07-24 ENCOUNTER — Ambulatory Visit: Payer: Self-pay

## 2014-08-21 ENCOUNTER — Other Ambulatory Visit: Payer: Self-pay

## 2014-08-21 DIAGNOSIS — Z1231 Encounter for screening mammogram for malignant neoplasm of breast: Secondary | ICD-10-CM

## 2014-08-25 ENCOUNTER — Encounter (HOSPITAL_COMMUNITY): Payer: Self-pay | Admitting: Family Medicine

## 2014-08-25 ENCOUNTER — Emergency Department (HOSPITAL_COMMUNITY)
Admission: EM | Admit: 2014-08-25 | Discharge: 2014-08-25 | Disposition: A | Discharge status: Home / Self Care | Payer: BC Managed Care – PPO | Source: Home / Self Care | Attending: Family Medicine | Admitting: Family Medicine

## 2014-08-25 DIAGNOSIS — M9420 Chondromalacia, unspecified site: Secondary | ICD-10-CM

## 2014-08-25 DIAGNOSIS — M5380 Other specified dorsopathies, site unspecified: Secondary | ICD-10-CM

## 2014-08-25 DIAGNOSIS — M62830 Muscle spasm of back: Secondary | ICD-10-CM

## 2014-08-25 DIAGNOSIS — M5431 Sciatica, right side: Secondary | ICD-10-CM

## 2014-08-25 DIAGNOSIS — M5430 Sciatica, unspecified side: Secondary | ICD-10-CM

## 2014-08-25 HISTORY — DX: Irritable bowel syndrome, unspecified: K58.9

## 2014-08-25 HISTORY — DX: Depression, unspecified: F32.A

## 2014-08-25 HISTORY — DX: Hyperlipidemia, unspecified: E78.5

## 2014-08-25 HISTORY — DX: Migraine, unspecified, not intractable, without status migrainosus: G43.909

## 2014-08-25 HISTORY — DX: Major depressive disorder, single episode, unspecified: F32.9

## 2014-08-25 MED ORDER — ACETAMINOPHEN 325 MG PO TABS
ORAL_TABLET | ORAL | Status: AC
  Filled 2014-08-25: qty 3

## 2014-08-25 MED ORDER — PREDNISONE 50 MG PO TABS: ORAL_TABLET | ORAL | Status: DC

## 2014-08-25 MED ORDER — METHYLPREDNISOLONE SODIUM SUCC 125 MG IJ SOLR
INTRAMUSCULAR | Status: AC
  Filled 2014-08-25: qty 2

## 2014-08-25 MED ORDER — METHYLPREDNISOLONE SODIUM SUCC 125 MG IJ SOLR
62.5000 mg | Freq: Once | INTRAMUSCULAR | Status: AC
  Administered 2014-08-25: 62.5 mg via INTRAMUSCULAR

## 2014-08-25 MED ORDER — CYCLOBENZAPRINE HCL 5 MG PO TABS: 5.0000 mg | ORAL_TABLET | Freq: Three times a day (TID) | ORAL | Status: DC | PRN

## 2014-08-25 MED ORDER — ACETAMINOPHEN 500 MG PO TABS
1000.0000 mg | ORAL_TABLET | Freq: Once | ORAL | Status: AC
  Administered 2014-08-25: 1000 mg via ORAL

## 2014-08-25 NOTE — Discharge Instructions (Signed)
You are likely experiencing muscle spasms of the lower back and mild sciatica Please stop the baclofen and start the flexeril. This is a muscle relaxer that should help significantly Please start taking prednisone with breakfast Please stop the meloxicam while on the steroids Please start taking tylenol 1gm every 8 hours Please start using a heating pad, massage, increased daily activity, and the exercises as outlined below.  Please come back if you get worse or do not improve in 2-4 weeks Remember to do the leg strengthening exercises to help with the knee grinding (chondromalacia)  Knee Exercises EXERCISES RANGE OF MOTION (ROM) AND STRETCHING EXERCISES These exercises may help you when beginning to rehabilitate your injury. Your symptoms may resolve with or without further involvement from your physician, physical therapist, or athletic trainer. While completing these exercises, remember:   Restoring tissue flexibility helps normal motion to return to the joints. This allows healthier, less painful movement and activity.  An effective stretch should be held for at least 30 seconds.  A stretch should never be painful. You should only feel a gentle lengthening or release in the stretched tissue. STRETCH - Knee Extension, Prone  Lie on your stomach on a firm surface, such as a bed or countertop. Place your right / left knee and leg just beyond the edge of the surface. You may wish to place a towel under the far end of your right / left thigh for comfort.  Relax your leg muscles and allow gravity to straighten your knee. Your clinician may advise you to add an ankle weight if more resistance is helpful for you.  You should feel a stretch in the back of your right / left knee. Hold this position for __________ seconds. Repeat __________ times. Complete this stretch __________ times per day. * Your physician, physical therapist, or athletic trainer may ask you to add ankle weight to enhance  your stretch.  RANGE OF MOTION - Knee Flexion, Active  Lie on your back with both knees straight. (If this causes back discomfort, bend your opposite knee, placing your foot flat on the floor.)  Slowly slide your heel back toward your buttocks until you feel a gentle stretch in the front of your knee or thigh.  Hold for __________ seconds. Slowly slide your heel back to the starting position. Repeat __________ times. Complete this exercise __________ times per day.  STRETCH - Quadriceps, Prone   Lie on your stomach on a firm surface, such as a bed or padded floor.  Bend your right / left knee and grasp your ankle. If you are unable to reach your ankle or pant leg, use a belt around your foot to lengthen your reach.  Gently pull your heel toward your buttocks. Your knee should not slide out to the side. You should feel a stretch in the front of your thigh and/or knee.  Hold this position for __________ seconds. Repeat __________ times. Complete this stretch __________ times per day.  STRETCH - Hamstrings, Supine   Lie on your back. Loop a belt or towel over the ball of your right / left foot.  Straighten your right / left knee and slowly pull on the belt to raise your leg. Do not allow the right / left knee to bend. Keep your opposite leg flat on the floor.  Raise the leg until you feel a gentle stretch behind your right / left knee or thigh. Hold this position for __________ seconds. Repeat __________ times. Complete this stretch __________  times per day.  STRENGTHENING EXERCISES These exercises may help you when beginning to rehabilitate your injury. They may resolve your symptoms with or without further involvement from your physician, physical therapist, or athletic trainer. While completing these exercises, remember:   Muscles can gain both the endurance and the strength needed for everyday activities through controlled exercises.  Complete these exercises as instructed by your  physician, physical therapist, or athletic trainer. Progress the resistance and repetitions only as guided.  You may experience muscle soreness or fatigue, but the pain or discomfort you are trying to eliminate should never worsen during these exercises. If this pain does worsen, stop and make certain you are following the directions exactly. If the pain is still present after adjustments, discontinue the exercise until you can discuss the trouble with your clinician. STRENGTH - Quadriceps, Isometrics  Lie on your back with your right / left leg extended and your opposite knee bent.  Gradually tense the muscles in the front of your right / left thigh. You should see either your knee cap slide up toward your hip or increased dimpling just above the knee. This motion will push the back of the knee down toward the floor/mat/bed on which you are lying.  Hold the muscle as tight as you can without increasing your pain for __________ seconds.  Relax the muscles slowly and completely in between each repetition. Repeat __________ times. Complete this exercise __________ times per day.  STRENGTH - Quadriceps, Short Arcs   Lie on your back. Place a __________ inch towel roll under your knee so that the knee slightly bends.  Raise only your lower leg by tightening the muscles in the front of your thigh. Do not allow your thigh to rise.  Hold this position for __________ seconds. Repeat __________ times. Complete this exercise __________ times per day.  OPTIONAL ANKLE WEIGHTS: Begin with ____________________, but DO NOT exceed ____________________. Increase in 1 pound/0.5 kilogram increments.  STRENGTH - Quadriceps, Straight Leg Raises  Quality counts! Watch for signs that the quadriceps muscle is working to insure you are strengthening the correct muscles and not "cheating" by substituting with healthier muscles.  Lay on your back with your right / left leg extended and your opposite knee  bent.  Tense the muscles in the front of your right / left thigh. You should see either your knee cap slide up or increased dimpling just above the knee. Your thigh may even quiver.  Tighten these muscles even more and raise your leg 4 to 6 inches off the floor. Hold for __________ seconds.  Keeping these muscles tense, lower your leg.  Relax the muscles slowly and completely in between each repetition. Repeat __________ times. Complete this exercise __________ times per day.  STRENGTH - Hamstring, Curls  Lay on your stomach with your legs extended. (If you lay on a bed, your feet may hang over the edge.)  Tighten the muscles in the back of your thigh to bend your right / left knee up to 90 degrees. Keep your hips flat on the bed/floor.  Hold this position for __________ seconds.  Slowly lower your leg back to the starting position. Repeat __________ times. Complete this exercise __________ times per day.  OPTIONAL ANKLE WEIGHTS: Begin with ____________________, but DO NOT exceed ____________________. Increase in 1 pound/0.5 kilogram increments.  STRENGTH - Quadriceps, Squats  Stand in a door frame so that your feet and knees are in line with the frame.  Use your hands for balance,  not support, on the frame.  Slowly lower your weight, bending at the hips and knees. Keep your lower legs upright so that they are parallel with the door frame. Squat only within the range that does not increase your knee pain. Never let your hips drop below your knees.  Slowly return upright, pushing with your legs, not pulling with your hands. Repeat __________ times. Complete this exercise __________ times per day.  STRENGTH - Quadriceps, Wall Slides  Follow guidelines for form closely. Increased knee pain often results from poorly placed feet or knees.  Lean against a smooth wall or door and walk your feet out 18-24 inches. Place your feet hip-width apart.  Slowly slide down the wall or door until  your knees bend __________ degrees.* Keep your knees over your heels, not your toes, and in line with your hips, not falling to either side.  Hold for __________ seconds. Stand up to rest for __________ seconds in between each repetition. Repeat __________ times. Complete this exercise __________ times per day. * Your physician, physical therapist, or athletic trainer will alter this angle based on your symptoms and progress. Document Released: 10/13/2005 Document Revised: 04/15/2014 Document Reviewed: 03/13/2009 [facility] Patient Information 2015 [place], [place]. This information is not intended to replace advice given to you by your health care provider. Make sure you discuss any questions you have with your health care provider. Sciatica Sciatica is pain, weakness, numbness, or tingling along the path of the sciatic nerve. The nerve starts in the lower back and runs down the back of each leg. The nerve controls the muscles in the lower leg and in the back of the knee, while also providing sensation to the back of the thigh, lower leg, and the sole of your foot. Sciatica is a symptom of another medical condition. For instance, nerve damage or certain conditions, such as a herniated disk or bone spur on the spine, pinch or put pressure on the sciatic nerve. This causes the pain, weakness, or other sensations normally associated with sciatica. Generally, sciatica only affects one side of the body. CAUSES   Herniated or slipped disc.  Degenerative disk disease.  A pain disorder involving the narrow muscle in the buttocks (piriformis syndrome).  Pelvic injury or fracture.  Pregnancy.  Tumor (rare). SYMPTOMS  Symptoms can vary from mild to very severe. The symptoms usually travel from the low back to the buttocks and down the back of the leg. Symptoms can include:  Mild tingling or dull aches in the lower back, leg, or hip.  Numbness in the back of the calf or sole of the foot.  Burning  sensations in the lower back, leg, or hip.  Sharp pains in the lower back, leg, or hip.  Leg weakness.  Severe back pain inhibiting movement. These symptoms may get worse with coughing, sneezing, laughing, or prolonged sitting or standing. Also, being overweight may worsen symptoms. DIAGNOSIS  Your caregiver will perform a physical exam to look for common symptoms of sciatica. He or she may ask you to do certain movements or activities that would trigger sciatic nerve pain. Other tests may be performed to find the cause of the sciatica. These may include:  Blood tests.  X-rays.  Imaging tests, such as an MRI or CT scan. TREATMENT  Treatment is directed at the cause of the sciatic pain. Sometimes, treatment is not necessary and the pain and discomfort goes away on its own. If treatment is needed, your caregiver may suggest:  Over-the-counter  medicines to relieve pain.  Prescription medicines, such as anti-inflammatory medicine, muscle relaxants, or narcotics.  Applying heat or ice to the painful area.  Steroid injections to lessen pain, irritation, and inflammation around the nerve.  Reducing activity during periods of pain.  Exercising and stretching to strengthen your abdomen and improve flexibility of your spine. Your caregiver may suggest losing weight if the extra weight makes the back pain worse.  Physical therapy.  Surgery to eliminate what is pressing or pinching the nerve, such as a bone spur or part of a herniated disk. HOME CARE INSTRUCTIONS   Only take over-the-counter or prescription medicines for pain or discomfort as directed by your caregiver.  Apply ice to the affected area for 20 minutes, 3-4 times a day for the first 48-72 hours. Then try heat in the same way.  Exercise, stretch, or perform your usual activities if these do not aggravate your pain.  Attend physical therapy sessions as directed by your caregiver.  Keep all follow-up appointments as  directed by your caregiver.  Do not wear high heels or shoes that do not provide proper support.  Check your mattress to see if it is too soft. A firm mattress may lessen your pain and discomfort. SEEK IMMEDIATE MEDICAL CARE IF:   You lose control of your bowel or bladder (incontinence).  You have increasing weakness in the lower back, pelvis, buttocks, or legs.  You have redness or swelling of your back.  You have a burning sensation when you urinate.  You have pain that gets worse when you lie down or awakens you at night.  Your pain is worse than you have experienced in the past.  Your pain is lasting longer than 4 weeks.  You are suddenly losing weight without reason. MAKE SURE YOU:  Understand these instructions.  Will watch your condition.  Will get help right away if you are not doing well or get worse. Document Released: 11/23/2001 Document Revised: 05/30/2012 Document Reviewed: 04/09/2012 [facility] Patient Information 2015 [place], [place]. This information is not intended to replace advice given to you by your health care provider. Make sure you discuss any questions you have with your health care provider.

## 2014-08-25 NOTE — ED Notes (Signed)
Pain in both hips and into both thighs started Friday night.  Patient reports pain is worsening and spreading.  Patient is unable to sleep.  Pain is now across lower back and down to feet.  Denies urinary symptoms, last bm was today and normal

## 2014-08-25 NOTE — ED Provider Notes (Signed)
CSN: [id number]     Arrival date & time 08/25/14  1622 History   First MD Initiated Contact with Patient 08/25/14 1643     Chief Complaint  Patient presents with  . Back Pain   (Consider location/radiation/quality/duration/timing/severity/associated sxs/prior Treatment) HPI  Lower back pain. Started Friday. Constant. Difficulty sleeping due to pain. Unsure of what may have started episode. Chronic condition for pt. Radiation down legs bilat to feet from time to time. Took Baclofen adn xanax w/o much improvement. Taking meloxicam 15mg  daily w/o much improvement. Getting worse.   R knee: crunching sound when moving leg. Present for years. Intermittently painful  Past Medical History  Diagnosis Date  . Migraine   . HLD (hyperlipidemia)   . Depression   . IBS (irritable bowel syndrome)    Past Surgical History  Procedure Laterality Date  . Tubal ligation    . Partial hysterectomy    . Abdominal hysterectomy     No family history on file. History  Substance Use Topics  . Smoking status: Current Every Day Smoker -- 0.50 packs/day    Types: Cigarettes  . Smokeless tobacco: Not on file  . Alcohol Use: No   OB History   Grav Para Term Preterm Abortions TAB SAB Ect Mult Living                 Review of Systems Per HPI with all other pertinent systems negative.   Allergies  Review of patient's allergies indicates no known allergies.  Home Medications   Prior to Admission medications   Medication Sig Start Date End Date Taking? Authorizing Provider  atorvastatin (LIPITOR) 20 MG tablet Take 20 mg by mouth daily.   Yes Historical Provider, MD  baclofen (LIORESAL) 10 MG tablet Take 10 mg by mouth 3 (three) times daily.   Yes Historical Provider, MD  dexlansoprazole (DEXILANT) 60 MG capsule Take 60 mg by mouth daily.   Yes Historical Provider, MD  Linaclotide Rolan Lipa) 145 MCG CAPS capsule Take 145 mcg by mouth daily.   Yes Historical Provider, MD  meloxicam (MOBIC) 15 MG tablet  Take 15 mg by mouth daily.   Yes Historical Provider, MD  varenicline (CHANTIX) 0.5 MG tablet Take 0.5 mg by mouth 2 (two) times daily.   Yes Historical Provider, MD  zonisamide (ZONEGRAN) 25 MG capsule Take 25 mg by mouth daily.   Yes Historical Provider, MD  ALPRAZolam Duanne Moron) 0.5 MG tablet Take 0.5 mg by mouth 2 (two) times daily as needed. For anxiety    Historical Provider, MD  buPROPion (WELLBUTRIN XL) 150 MG 24 hr tablet Take 150 mg by mouth every morning.    Historical Provider, MD  estradiol (ESTRACE) 1 MG tablet Take 1 mg by mouth daily.    Historical Provider, MD  HYDROcodone-acetaminophen (NORCO/VICODIN) 5-325 MG per tablet Take 1 to 2 tabs every 4-6hrs as needed for pain 04/04/13   Noland Fordyce, PA-C  venlafaxine XR (EFFEXOR-XR) 75 MG 24 hr capsule Take 75 mg by mouth every morning.    Historical Provider, MD   BP 125/72  Pulse 83  Temp(Src) 98.9 F (37.2 C) (Oral)  Resp 18  SpO2 98% Physical Exam  Constitutional: She is oriented to person, place, and time. She appears well-developed and well-nourished. No distress.  HENT:  Head: Normocephalic and atraumatic.  Eyes: EOM are normal. Pupils are equal, round, and reactive to light.  Neck: Normal range of motion.  Cardiovascular: Normal rate, normal heart sounds and intact distal pulses.  Exam  reveals no gallop.   No murmur heard. Pulmonary/Chest: Effort normal and breath sounds normal.  Abdominal: Soft. Bowel sounds are normal. She exhibits no distension. There is no tenderness.  Musculoskeletal: Normal range of motion.  Lumbar perispinal muscles mildly ttp and tight bilat. FROM.  Straight leg raise minimally + bilat (R>L). FABERS neg bilat No effusions R knee w/ crepitus w/ movement. L knee nml  Neurological: She is alert and oriented to person, place, and time. No cranial nerve deficit. She exhibits normal muscle tone. Coordination normal.  Skin: Skin is warm and dry. She is not diaphoretic.  Psychiatric: She has a normal  mood and affect. Her behavior is normal.    ED Course  Procedures (including critical care time) Labs Review Labs Reviewed - No data to display  Imaging Review No results found.   MDM   1. Back spasm   2. Sciatica,   3. Chondromalacia    Intermittently taking baclofen (only 10mg  a couple times a week) - Start flexeril QHS and up to TID PRN (stop baclofen) - heat and massage.  - Solumedrol 62.5mg  IM in office - Start prednisone 50 x 5 days Start sciatica exercises and vastus medialis workout discussed and handouts given Restart meloxicam after steroids F/u PCP in 2-4 wks.   Precautions given and all questions answered   Linna Darner, MD Family Medicine 08/25/2014, 5:25 PM       Waldemar Dickens, MD 08/25/14 1725

## 2014-09-10 ENCOUNTER — Emergency Department (HOSPITAL_COMMUNITY)
Admission: EM | Admit: 2014-09-10 | Discharge: 2014-09-11 | Disposition: A | Discharge status: Home / Self Care | Payer: BC Managed Care – PPO | Attending: Emergency Medicine | Admitting: Emergency Medicine

## 2014-09-10 ENCOUNTER — Encounter (HOSPITAL_COMMUNITY): Payer: Self-pay | Admitting: Emergency Medicine

## 2014-09-10 ENCOUNTER — Emergency Department (HOSPITAL_COMMUNITY): Payer: BC Managed Care – PPO

## 2014-09-10 DIAGNOSIS — G43909 Migraine, unspecified, not intractable, without status migrainosus: Secondary | ICD-10-CM | POA: Insufficient documentation

## 2014-09-10 DIAGNOSIS — R079 Chest pain, unspecified: Secondary | ICD-10-CM | POA: Insufficient documentation

## 2014-09-10 DIAGNOSIS — R0789 Other chest pain: Secondary | ICD-10-CM | POA: Diagnosis not present

## 2014-09-10 DIAGNOSIS — Z8719 Personal history of other diseases of the digestive system: Secondary | ICD-10-CM | POA: Insufficient documentation

## 2014-09-10 DIAGNOSIS — Z791 Long term (current) use of non-steroidal anti-inflammatories (NSAID): Secondary | ICD-10-CM | POA: Diagnosis not present

## 2014-09-10 DIAGNOSIS — F17200 Nicotine dependence, unspecified, uncomplicated: Secondary | ICD-10-CM | POA: Diagnosis not present

## 2014-09-10 DIAGNOSIS — F329 Major depressive disorder, single episode, unspecified: Secondary | ICD-10-CM | POA: Diagnosis not present

## 2014-09-10 DIAGNOSIS — Z79899 Other long term (current) drug therapy: Secondary | ICD-10-CM | POA: Diagnosis not present

## 2014-09-10 DIAGNOSIS — E785 Hyperlipidemia, unspecified: Secondary | ICD-10-CM | POA: Diagnosis not present

## 2014-09-10 DIAGNOSIS — J209 Acute bronchitis, unspecified: Secondary | ICD-10-CM | POA: Insufficient documentation

## 2014-09-10 DIAGNOSIS — F3289 Other specified depressive episodes: Secondary | ICD-10-CM | POA: Diagnosis not present

## 2014-09-10 HISTORY — DX: Nicotine dependence, unspecified, uncomplicated: F17.200

## 2014-09-10 LAB — CBC
HCT: 44.9 % (ref 36.0–46.0)
Hemoglobin: 15.6 g/dL — ABNORMAL HIGH (ref 12.0–15.0)
MCH: 32.8 pg (ref 26.0–34.0)
MCHC: 34.7 g/dL (ref 30.0–36.0)
MCV: 94.3 fL (ref 78.0–100.0)
Platelets: 212 10*3/uL (ref 150–400)
RBC: 4.76 MIL/uL (ref 3.87–5.11)
RDW: 12.8 % (ref 11.5–15.5)
WBC: 9.2 10*3/uL (ref 4.0–10.5)

## 2014-09-10 LAB — BASIC METABOLIC PANEL
Anion gap: 13 (ref 5–15)
BUN: 9 mg/dL (ref 6–23)
CALCIUM: 10.5 mg/dL (ref 8.4–10.5)
CO2: 21 mEq/L (ref 19–32)
CREATININE: 0.79 mg/dL (ref 0.50–1.10)
Chloride: 108 mEq/L (ref 96–112)
Glucose, Bld: 113 mg/dL — ABNORMAL HIGH (ref 70–99)
Potassium: 3.8 mEq/L (ref 3.7–5.3)
Sodium: 142 mEq/L (ref 137–147)

## 2014-09-10 LAB — PRO B NATRIURETIC PEPTIDE: Pro B Natriuretic peptide (BNP): 26.8 pg/mL (ref 0–125)

## 2014-09-10 LAB — I-STAT TROPONIN, ED: Troponin i, poc: 0 ng/mL (ref 0.00–0.08)

## 2014-09-10 MED ORDER — AZITHROMYCIN 250 MG PO TABS: ORAL_TABLET | ORAL | Status: DC

## 2014-09-10 MED ORDER — KETOROLAC TROMETHAMINE 60 MG/2ML IM SOLN
60.0000 mg | Freq: Once | INTRAMUSCULAR | Status: AC
  Administered 2014-09-10: 60 mg via INTRAMUSCULAR
  Filled 2014-09-10: qty 2

## 2014-09-10 NOTE — ED Notes (Signed)
Pt. reports mid chest pain /tightness with SOB , nausea and diaphoresis onset yesterday with occasional productive cough , chronic smoker , denies fever or chills.

## 2014-09-10 NOTE — Discharge Instructions (Signed)
Zithromax as prescribed.  Return to the ER if your symptoms worsen or change.  Followup with your doctor if not improving in the next several days.   Acute Bronchitis Bronchitis is inflammation of the airways that extend from the windpipe into the lungs (bronchi). The inflammation often causes mucus to develop. This leads to a cough, which is the most common symptom of bronchitis.  In acute bronchitis, the condition usually develops suddenly and goes away over time, usually in a couple weeks. Smoking, allergies, and asthma can make bronchitis worse. Repeated episodes of bronchitis may cause further lung problems.  CAUSES Acute bronchitis is most often caused by the same virus that causes a cold. The virus can spread from person to person (contagious) through coughing, sneezing, and touching contaminated objects. SIGNS AND SYMPTOMS   Cough.   Fever.   Coughing up mucus.   Body aches.   Chest congestion.   Chills.   Shortness of breath.   Sore throat.  DIAGNOSIS  Acute bronchitis is usually diagnosed through a physical exam. Your health care provider will also ask you questions about your medical history. Tests, such as chest X-rays, are sometimes done to rule out other conditions.  TREATMENT  Acute bronchitis usually goes away in a couple weeks. Oftentimes, no medical treatment is necessary. Medicines are sometimes given for relief of fever or cough. Antibiotic medicines are usually not needed but may be prescribed in certain situations. In some cases, an inhaler may be recommended to help reduce shortness of breath and control the cough. A cool mist vaporizer may also be used to help thin bronchial secretions and make it easier to clear the chest.  HOME CARE INSTRUCTIONS  Get plenty of rest.   Drink enough fluids to keep your urine clear or pale yellow (unless you have a medical condition that requires fluid restriction). Increasing fluids may help thin your respiratory  secretions (sputum) and reduce chest congestion, and it will prevent dehydration.   Take medicines only as directed by your health care provider.  If you were prescribed an antibiotic medicine, finish it all even if you start to feel better.  Avoid smoking and secondhand smoke. Exposure to cigarette smoke or irritating chemicals will make bronchitis worse. If you are a smoker, consider using nicotine gum or skin patches to help control withdrawal symptoms. Quitting smoking will help your lungs heal faster.   Reduce the chances of another bout of acute bronchitis by washing your hands frequently, avoiding people with cold symptoms, and trying not to touch your hands to your mouth, nose, or eyes.   Keep all follow-up visits as directed by your health care provider.  SEEK MEDICAL CARE IF: Your symptoms do not improve after 1 week of treatment.  SEEK IMMEDIATE MEDICAL CARE IF:  You develop an increased fever or chills.   You have chest pain.   You have severe shortness of breath.  You have bloody sputum.   You develop dehydration.  You faint or repeatedly feel like you are going to pass out.  You develop repeated vomiting.  You develop a severe headache. MAKE SURE YOU:   Understand these instructions.  Will watch your condition.  Will get help right away if you are not doing well or get worse. Document Released: 01/06/2005 Document Revised: 04/15/2014 Document Reviewed: 05/22/2013 [facility] Patient Information 2015 [place], [place]. This information is not intended to replace advice given to you by your health care provider. Make sure you discuss any questions you have  with your health care provider.  Chest Pain (Nonspecific) It is often hard to give a specific diagnosis for the cause of chest pain. There is always a chance that your pain could be related to something serious, such as a heart attack or a blood clot in the lungs. You need to follow up with your health care  provider for further evaluation. CAUSES   Heartburn.  Pneumonia or bronchitis.  Anxiety or stress.  Inflammation around your heart (pericarditis) or lung (pleuritis or pleurisy).  A blood clot in the lung.  A collapsed lung (pneumothorax). It can develop suddenly on its own (spontaneous pneumothorax) or from trauma to the chest.  Shingles infection (herpes zoster virus). The chest wall is composed of bones, muscles, and cartilage. Any of these can be the source of the pain.  The bones can be bruised by injury.  The muscles or cartilage can be strained by coughing or overwork.  The cartilage can be affected by inflammation and become sore (costochondritis). DIAGNOSIS  Lab tests or other studies may be needed to find the cause of your pain. Your health care provider may have you take a test called an ambulatory electrocardiogram (ECG). An ECG records your heartbeat patterns over a 24-hour period. You may also have other tests, such as:  Transthoracic echocardiogram (TTE). During echocardiography, sound waves are used to evaluate how blood flows through your heart.  Transesophageal echocardiogram (TEE).  Cardiac monitoring. This allows your health care provider to monitor your heart rate and rhythm in real time.  Holter monitor. This is a portable device that records your heartbeat and can help diagnose heart arrhythmias. It allows your health care provider to track your heart activity for several days, if needed.  Stress tests by exercise or by giving medicine that makes the heart beat faster. TREATMENT   Treatment depends on what may be causing your chest pain. Treatment may include:  Acid blockers for heartburn.  Anti-inflammatory medicine.  Pain medicine for inflammatory conditions.  Antibiotics if an infection is present.  You may be advised to change lifestyle habits. This includes stopping smoking and avoiding alcohol, caffeine, and chocolate.  You may be advised  to keep your head raised (elevated) when sleeping. This reduces the chance of acid going backward from your stomach into your esophagus. Most of the time, nonspecific chest pain will improve within 2-3 days with rest and mild pain medicine.  HOME CARE INSTRUCTIONS   If antibiotics were prescribed, take them as directed. Finish them even if you start to feel better.  For the next few days, avoid physical activities that bring on chest pain. Continue physical activities as directed.  Do not use any tobacco products, including cigarettes, chewing tobacco, or electronic cigarettes.  Avoid drinking alcohol.  Only take medicine as directed by your health care provider.  Follow your health care provider's suggestions for further testing if your chest pain does not go away.  Keep any follow-up appointments you made. If you do not go to an appointment, you could develop lasting (chronic) problems with pain. If there is any problem keeping an appointment, call to reschedule. SEEK MEDICAL CARE IF:   Your chest pain does not go away, even after treatment.  You have a rash with blisters on your chest.  You have a fever. SEEK IMMEDIATE MEDICAL CARE IF:   You have increased chest pain or pain that spreads to your arm, neck, jaw, back, or abdomen.  You have shortness of breath.  You  have an increasing cough, or you cough up blood.  You have severe back or abdominal pain.  You feel nauseous or vomit.  You have severe weakness.  You faint.  You have chills. This is an emergency. Do not wait to see if the pain will go away. Get medical help at once. Call your local emergency services (911 in [place] Do not drive yourself to the hospital. MAKE SURE YOU:   Understand these instructions.  Will watch your condition.  Will get help right away if you are not doing well or get worse. Document Released: 09/08/2005 Document Revised: 12/04/2013 Document Reviewed: 07/04/2008 [facility] Patient  Information 2015 [place], [place]. This information is not intended to replace advice given to you by your health care provider. Make sure you discuss any questions you have with your health care provider.

## 2014-09-10 NOTE — ED Provider Notes (Signed)
CSN: [id number]     Arrival date & time 09/10/14  2211 History   First MD Initiated Contact with Patient 09/10/14 2259     Chief Complaint  Patient presents with  . Chest Pain     (Consider location/radiation/quality/duration/timing/severity/associated sxs/prior Treatment) HPI Comments: Patient is a 53 year old female with history of migraines, irritable bowel, and depression. She presents with complaints of tightness in the right upper chest that she states has been worsening over the past 24 hours. She reports some congestion in her chest and productive cough. She feels short of breath. She also states she has been having worsening "migraines" over the same period of time. She has no prior cardiac history. She does smoke cigarettes.  Patient is a 53 y.o. female presenting with chest pain. The history is provided by the patient.  Chest Pain Pain location:  R chest Pain quality: tightness   Pain radiates to:  Does not radiate Pain radiates to the back: no   Pain severity:  Moderate Onset quality:  Sudden Timing:  Constant Progression:  Worsening Chronicity:  New Relieved by:  Nothing Worsened by:  Nothing tried Ineffective treatments:  None tried Associated symptoms: no abdominal pain     Past Medical History  Diagnosis Date  . Migraine   . HLD (hyperlipidemia)   . Depression   . IBS (irritable bowel syndrome)   . Current smoker    Past Surgical History  Procedure Laterality Date  . Tubal ligation    . Partial hysterectomy    . Abdominal hysterectomy     No family history on file. History  Substance Use Topics  . Smoking status: Current Every Day Smoker -- 0.50 packs/day    Types: Cigarettes  . Smokeless tobacco: Not on file  . Alcohol Use: No   OB History   Grav Para Term Preterm Abortions TAB SAB Ect Mult Living                 Review of Systems  Cardiovascular: Positive for chest pain.  Gastrointestinal: Negative for abdominal pain.  All other systems  reviewed and are negative.     Allergies  Review of patient's allergies indicates no known allergies.  Home Medications   Prior to Admission medications   Medication Sig Start Date End Date Taking? Authorizing Provider  ALPRAZolam Duanne Moron) 0.5 MG tablet Take 0.5 mg by mouth 2 (two) times daily as needed. For anxiety   Yes Historical Provider, MD  atorvastatin (LIPITOR) 20 MG tablet Take 20 mg by mouth daily.   Yes Historical Provider, MD  baclofen (LIORESAL) 10 MG tablet Take 10 mg by mouth 2 (two) times daily as needed. For headache per patient   Yes Historical Provider, MD  cyclobenzaprine (FLEXERIL) 5 MG tablet Take 5-10 mg by mouth 3 (three) times daily as needed for muscle spasms. 08/25/14  Yes Waldemar Dickens, MD  estradiol (ESTRACE) 1 MG tablet Take 1 mg by mouth daily.   Yes Historical Provider, MD  famotidine (PEPCID) 10 MG tablet Take 10 mg by mouth daily.    Yes Historical Provider, MD  Linaclotide Rolan Lipa) 145 MCG CAPS capsule Take 145 mcg by mouth daily.   Yes Historical Provider, MD  meloxicam (MOBIC) 15 MG tablet Take 15 mg by mouth daily.   Yes Historical Provider, MD  zonisamide (ZONEGRAN) 25 MG capsule Take 100 mg by mouth daily.    Yes Historical Provider, MD   BP 145/79  Pulse 101  Resp 17  SpO2 100%  Physical Exam  Nursing note and vitals reviewed. Constitutional: She is oriented to person, place, and time. She appears well-developed and well-nourished. No distress.  HENT:  Head: Normocephalic and atraumatic.  Neck: Normal range of motion. Neck supple.  Cardiovascular: Normal rate and regular rhythm.  Exam reveals no gallop and no friction rub.   No murmur heard. Pulmonary/Chest: Effort normal and breath sounds normal. No respiratory distress. She has no wheezes. She exhibits tenderness.  There is tenderness to palpation of the right upper chest which seems to reproduce her symptoms.  Abdominal: Soft. Bowel sounds are normal. She exhibits no distension. There is  no tenderness.  Musculoskeletal: Normal range of motion.  Neurological: She is alert and oriented to person, place, and time.  Skin: Skin is warm and dry. She is not diaphoretic.    ED Course  Procedures (including critical care time) Labs Review Labs Reviewed  CBC - Abnormal; Notable for the following:    Hemoglobin 15.6 (*)    All other components within normal limits  BASIC METABOLIC PANEL - Abnormal; Notable for the following:    Glucose, Bld 113 (*)    All other components within normal limits  PRO B NATRIURETIC PEPTIDE  I-STAT TROPOININ, ED    Imaging Review Dg Chest 2 View  09/10/2014   CLINICAL DATA:  Right-sided chest tightness and pain. Pain radiates down the right arm. Shortness of breath, nausea, and headache. Smoker.  EXAM: CHEST  2 VIEW  COMPARISON:  None.  FINDINGS: The heart size and mediastinal contours are within normal limits. Both lungs are clear. The visualized skeletal structures are unremarkable.  IMPRESSION: No active cardiopulmonary disease.   Electronically Signed   By: Lucienne Capers M.D.   On: 09/10/2014 23:02     Date: 09/11/2014  Rate: 80's  Rhythm: sinus  QRS Axis: normal  Intervals: normal  ST/T Wave abnormalities: none  Conduction Disutrbances:none  Narrative Interpretation:   Old EKG Reviewed: none available    MDM   Final diagnoses:  None    Patient presents with chest congestion for one week and tightness in the right upper chest for several days.  Troponin is negative and ekg is unchanged.  I doubt a cardiac etiology.  Chest xray does not reveal a pneumonia, but clinically sounds like bronchitis.  Will treat with zmax, return prn.    Veryl Speak, MD 09/11/14 [phone]

## 2014-09-17 ENCOUNTER — Ambulatory Visit
Admission: RE | Admit: 2014-09-17 | Discharge: 2014-09-17 | Disposition: A | Discharge status: Home / Self Care | Payer: BC Managed Care – PPO | Source: Ambulatory Visit

## 2014-09-17 DIAGNOSIS — Z1231 Encounter for screening mammogram for malignant neoplasm of breast: Secondary | ICD-10-CM

## 2015-02-11 ENCOUNTER — Ambulatory Visit
Admission: RE | Admit: 2015-02-11 | Discharge: 2015-02-11 | Disposition: A | Discharge status: Home / Self Care | Payer: 59 | Source: Ambulatory Visit | Attending: Physician Assistant | Admitting: Physician Assistant

## 2015-02-11 ENCOUNTER — Other Ambulatory Visit: Payer: Self-pay | Admitting: Physician Assistant

## 2015-02-11 DIAGNOSIS — M25559 Pain in unspecified hip: Secondary | ICD-10-CM

## 2015-02-14 ENCOUNTER — Other Ambulatory Visit (HOSPITAL_COMMUNITY): Payer: Self-pay | Admitting: Physician Assistant

## 2015-02-14 DIAGNOSIS — E349 Endocrine disorder, unspecified: Secondary | ICD-10-CM

## 2015-02-18 ENCOUNTER — Encounter (HOSPITAL_COMMUNITY)
Admission: RE | Admit: 2015-02-18 | Discharge: 2015-02-18 | Disposition: A | Discharge status: Home / Self Care | Payer: 59 | Source: Ambulatory Visit | Attending: Physician Assistant | Admitting: Physician Assistant

## 2015-02-18 DIAGNOSIS — E349 Endocrine disorder, unspecified: Secondary | ICD-10-CM | POA: Diagnosis present

## 2015-02-18 MED ORDER — TECHNETIUM TC 99M SESTAMIBI GENERIC - CARDIOLITE
25.0000 | Freq: Once | INTRAVENOUS | Status: AC | PRN
  Administered 2015-02-18: 25 via INTRAVENOUS

## 2015-03-11 ENCOUNTER — Other Ambulatory Visit: Payer: Self-pay | Admitting: Surgery

## 2015-03-11 DIAGNOSIS — D351 Benign neoplasm of parathyroid gland: Secondary | ICD-10-CM

## 2015-03-14 ENCOUNTER — Ambulatory Visit
Admission: RE | Admit: 2015-03-14 | Discharge: 2015-03-14 | Disposition: A | Discharge status: Home / Self Care | Payer: 59 | Source: Ambulatory Visit | Attending: Surgery | Admitting: Surgery

## 2015-03-19 ENCOUNTER — Ambulatory Visit: Payer: Self-pay | Admitting: Surgery

## 2015-04-04 ENCOUNTER — Encounter (HOSPITAL_COMMUNITY): Payer: Self-pay

## 2015-04-04 ENCOUNTER — Encounter (HOSPITAL_COMMUNITY)
Admission: RE | Admit: 2015-04-04 | Discharge: 2015-04-04 | Disposition: A | Discharge status: Home / Self Care | Payer: 59 | Source: Ambulatory Visit | Attending: Surgery | Admitting: Surgery

## 2015-04-04 DIAGNOSIS — F329 Major depressive disorder, single episode, unspecified: Secondary | ICD-10-CM | POA: Diagnosis not present

## 2015-04-04 DIAGNOSIS — E780 Pure hypercholesterolemia: Secondary | ICD-10-CM | POA: Diagnosis not present

## 2015-04-04 DIAGNOSIS — F17210 Nicotine dependence, cigarettes, uncomplicated: Secondary | ICD-10-CM | POA: Diagnosis not present

## 2015-04-04 DIAGNOSIS — G4730 Sleep apnea, unspecified: Secondary | ICD-10-CM | POA: Diagnosis not present

## 2015-04-04 DIAGNOSIS — D351 Benign neoplasm of parathyroid gland: Secondary | ICD-10-CM | POA: Diagnosis present

## 2015-04-04 DIAGNOSIS — E211 Secondary hyperparathyroidism, not elsewhere classified: Secondary | ICD-10-CM | POA: Diagnosis not present

## 2015-04-04 DIAGNOSIS — F419 Anxiety disorder, unspecified: Secondary | ICD-10-CM | POA: Diagnosis not present

## 2015-04-04 HISTORY — DX: Sleep apnea, unspecified: G47.30

## 2015-04-04 HISTORY — DX: Gastro-esophageal reflux disease without esophagitis: K21.9

## 2015-04-04 LAB — CBC
HEMATOCRIT: 45.5 % (ref 36.0–46.0)
HEMOGLOBIN: 15.1 g/dL — AB (ref 12.0–15.0)
MCH: 32.3 pg (ref 26.0–34.0)
MCHC: 33.2 g/dL (ref 30.0–36.0)
MCV: 97.4 fL (ref 78.0–100.0)
Platelets: 194 10*3/uL (ref 150–400)
RBC: 4.67 MIL/uL (ref 3.87–5.11)
RDW: 13.1 % (ref 11.5–15.5)
WBC: 9 10*3/uL (ref 4.0–10.5)

## 2015-04-04 LAB — BASIC METABOLIC PANEL
Anion gap: 7 (ref 5–15)
BUN: 11 mg/dL (ref 6–23)
CHLORIDE: 107 mmol/L (ref 96–112)
CO2: 25 mmol/L (ref 19–32)
CREATININE: 0.82 mg/dL (ref 0.50–1.10)
Calcium: 10.1 mg/dL (ref 8.4–10.5)
GFR calc non Af Amer: 80 mL/min — ABNORMAL LOW (ref >90)
GLUCOSE: 113 mg/dL — AB (ref 70–99)
POTASSIUM: 3.7 mmol/L (ref 3.5–5.1)
Sodium: 139 mmol/L (ref 135–145)

## 2015-04-04 NOTE — Pre-Procedure Instructions (Signed)
MARISUE CANION  04/04/2015   Your procedure is scheduled on:  Monday  04/14/15  Report to [facility] Admitting at 530 AM.  Call this number if you have problems the morning of surgery: [phone]   Remember:   Do not eat food or drink liquids after midnight.   Take these medicines the morning of surgery with A SIP OF WATER:  ALPRZOLAM(XANAX IF NEEDED), BUPROPION (WELLBUTRIN), ESTRADIOL(ESTRACE), PAROXETINE(PAXIL)                (STOP ASPIRIN, COUMADIN, PLAVIX, EFFIENT, HERBAL MEDICINES, MOBIC/ MELOXICAM)  Do not wear jewelry, make-up or nail polish.  Do not wear lotions, powders, or perfumes. You may wear deodorant.  Do not shave 48 hours prior to surgery. Men may shave face and neck.  Do not bring valuables to the hospital.  [facility] is not responsible                  for any belongings or valuables.               Contacts, dentures or bridgework may not be worn into surgery.  Leave suitcase in the car. After surgery it may be brought to your room.  For patients admitted to the hospital, discharge time is determined by your                treatment team.               Patients discharged the day of surgery will not be allowed to drive  home.  Name and phone number of your driver:  Special Instructions: [place] - Preparing for Surgery  Before surgery, you can play an important role.  Because skin is not sterile, your skin needs to be as free of germs as possible.  You can reduce the number of germs on you skin by washing with CHG (chlorahexidine gluconate) soap before surgery.  CHG is an antiseptic cleaner which kills germs and bonds with the skin to continue killing germs even after washing.  Please DO NOT use if you have an allergy to CHG or antibacterial soaps.  If your skin becomes reddened/irritated stop using the CHG and inform your nurse when you arrive at Short Stay.  Do not shave (including legs and underarms) for at least 48 hours prior to the first CHG  shower.  You may shave your face.  Please follow these instructions carefully:   1.  Shower with CHG Soap the night before surgery and the                                morning of Surgery.  2.  If you choose to wash your hair, wash your hair first as usual with your       normal shampoo.  3.  After you shampoo, rinse your hair and body thoroughly to remove the                      Shampoo.  4.  Use CHG as you would any other liquid soap.  You can apply chg directly       to the skin and wash gently with scrungie or a clean washcloth.  5.  Apply the CHG Soap to your body ONLY FROM THE NECK DOWN.        Do not use on open wounds or open sores.  Avoid  contact with your eyes,       ears, mouth and genitals (private parts).  Wash genitals (private parts)       with your normal soap.  6.  Wash thoroughly, paying special attention to the area where your surgery        will be performed.  7.  Thoroughly rinse your body with warm water from the neck down.  8.  DO NOT shower/wash with your normal soap after using and rinsing off       the CHG Soap.  9.  Pat yourself dry with a clean towel.            10.  Wear clean pajamas.            11.  Place clean sheets on your bed the night of your first shower and do not        sleep with pets.  Day of Surgery  Do not apply any lotions/deoderants the morning of surgery.  Please wear clean clothes to the hospital/surgery center.     Please read over the following fact sheets that you were given: Pain Booklet, Coughing and Deep Breathing and Surgical Site Infection Prevention

## 2015-04-06 NOTE — Progress Notes (Signed)
Quick Note:  These results are acceptable for scheduled surgery.  Hassie Mandt M. Kayson Tasker, MD, FACS [facility] Office: [phone]   ______ 

## 2015-04-14 ENCOUNTER — Observation Stay (HOSPITAL_COMMUNITY)
Admission: RE | Admit: 2015-04-14 | Discharge: 2015-04-15 | Disposition: A | Discharge status: Home / Self Care | Payer: 59 | Source: Ambulatory Visit | Attending: Surgery | Admitting: Surgery

## 2015-04-14 ENCOUNTER — Encounter (HOSPITAL_COMMUNITY): Payer: Self-pay | Admitting: *Deleted

## 2015-04-14 ENCOUNTER — Ambulatory Visit (HOSPITAL_COMMUNITY): Payer: 59 | Admitting: Anesthesiology

## 2015-04-14 ENCOUNTER — Encounter (HOSPITAL_COMMUNITY)
Admission: RE | Disposition: A | Discharge status: Home / Self Care | Payer: Self-pay | Source: Ambulatory Visit | Attending: Surgery

## 2015-04-14 DIAGNOSIS — D351 Benign neoplasm of parathyroid gland: Principal | ICD-10-CM | POA: Insufficient documentation

## 2015-04-14 DIAGNOSIS — E211 Secondary hyperparathyroidism, not elsewhere classified: Secondary | ICD-10-CM | POA: Insufficient documentation

## 2015-04-14 DIAGNOSIS — F329 Major depressive disorder, single episode, unspecified: Secondary | ICD-10-CM | POA: Insufficient documentation

## 2015-04-14 DIAGNOSIS — E210 Primary hyperparathyroidism: Secondary | ICD-10-CM | POA: Diagnosis present

## 2015-04-14 DIAGNOSIS — E780 Pure hypercholesterolemia: Secondary | ICD-10-CM | POA: Insufficient documentation

## 2015-04-14 DIAGNOSIS — G4730 Sleep apnea, unspecified: Secondary | ICD-10-CM | POA: Insufficient documentation

## 2015-04-14 DIAGNOSIS — F17210 Nicotine dependence, cigarettes, uncomplicated: Secondary | ICD-10-CM | POA: Insufficient documentation

## 2015-04-14 DIAGNOSIS — F419 Anxiety disorder, unspecified: Secondary | ICD-10-CM | POA: Insufficient documentation

## 2015-04-14 HISTORY — PX: PARATHYROIDECTOMY: SHX19

## 2015-04-14 SURGERY — PARATHYROIDECTOMY
Anesthesia: General | Site: Neck | Laterality: Right

## 2015-04-14 MED ORDER — HYDROMORPHONE HCL 1 MG/ML IJ SOLN
1.0000 mg | INTRAMUSCULAR | Status: DC | PRN
Start: 2015-04-14 — End: 2015-04-15
  Administered 2015-04-14 (×4): 1 mg via INTRAVENOUS
  Filled 2015-04-14 (×4): qty 1

## 2015-04-14 MED ORDER — ONDANSETRON HCL 4 MG/2ML IJ SOLN
INTRAMUSCULAR | Status: DC | PRN
  Administered 2015-04-14: 4 mg via INTRAVENOUS

## 2015-04-14 MED ORDER — MIDAZOLAM HCL 2 MG/2ML IJ SOLN
INTRAMUSCULAR | Status: AC
  Filled 2015-04-14: qty 2

## 2015-04-14 MED ORDER — HYDROCHLOROTHIAZIDE 25 MG PO TABS
25.0000 mg | ORAL_TABLET | Freq: Every day | ORAL | Status: DC
  Administered 2015-04-14 – 2015-04-15 (×2): 25 mg via ORAL
  Filled 2015-04-14 (×2): qty 1

## 2015-04-14 MED ORDER — MIDAZOLAM HCL 5 MG/5ML IJ SOLN
INTRAMUSCULAR | Status: DC | PRN
  Administered 2015-04-14: 2 mg via INTRAVENOUS

## 2015-04-14 MED ORDER — BUPIVACAINE HCL (PF) 0.25 % IJ SOLN
INTRAMUSCULAR | Status: AC
  Filled 2015-04-14: qty 30

## 2015-04-14 MED ORDER — LACTATED RINGERS IV SOLN
INTRAVENOUS | Status: DC | PRN
Start: 2015-04-14 — End: 2015-04-14
  Administered 2015-04-14: 07:00:00 via INTRAVENOUS

## 2015-04-14 MED ORDER — ALPRAZOLAM 0.5 MG PO TABS
0.5000 mg | ORAL_TABLET | Freq: Two times a day (BID) | ORAL | Status: DC | PRN
  Administered 2015-04-15: 0.5 mg via ORAL
  Filled 2015-04-14: qty 1

## 2015-04-14 MED ORDER — PAROXETINE HCL 20 MG PO TABS
20.0000 mg | ORAL_TABLET | Freq: Every day | ORAL | Status: DC
  Administered 2015-04-15: 20 mg via ORAL
  Filled 2015-04-14 (×2): qty 1

## 2015-04-14 MED ORDER — ROCURONIUM BROMIDE 50 MG/5ML IV SOLN
INTRAVENOUS | Status: AC
  Filled 2015-04-14: qty 1

## 2015-04-14 MED ORDER — LIDOCAINE HCL (CARDIAC) 20 MG/ML IV SOLN
INTRAVENOUS | Status: DC | PRN
  Administered 2015-04-14: 80 mg via INTRAVENOUS

## 2015-04-14 MED ORDER — ONDANSETRON HCL 4 MG PO TABS
4.0000 mg | ORAL_TABLET | Freq: Four times a day (QID) | ORAL | Status: DC | PRN
  Administered 2015-04-15: 4 mg via ORAL

## 2015-04-14 MED ORDER — NEOSTIGMINE METHYLSULFATE 10 MG/10ML IV SOLN
INTRAVENOUS | Status: DC | PRN
Start: 2015-04-14 — End: 2015-04-14
  Administered 2015-04-14: 4 mg via INTRAVENOUS

## 2015-04-14 MED ORDER — LIDOCAINE HCL 4 % MT SOLN
OROMUCOSAL | Status: DC | PRN
  Administered 2015-04-14: 4 mL via TOPICAL

## 2015-04-14 MED ORDER — PROPOFOL 10 MG/ML IV BOLUS
INTRAVENOUS | Status: AC
  Filled 2015-04-14: qty 20

## 2015-04-14 MED ORDER — PROPOFOL 10 MG/ML IV BOLUS
INTRAVENOUS | Status: DC | PRN
  Administered 2015-04-14: 150 mg via INTRAVENOUS

## 2015-04-14 MED ORDER — BUPIVACAINE HCL (PF) 0.25 % IJ SOLN
INTRAMUSCULAR | Status: DC | PRN
  Administered 2015-04-14: 10 mL

## 2015-04-14 MED ORDER — CEFAZOLIN SODIUM-DEXTROSE 2-3 GM-% IV SOLR
2.0000 g | INTRAVENOUS | Status: AC
  Administered 2015-04-14: 2 g via INTRAVENOUS
  Filled 2015-04-14: qty 50

## 2015-04-14 MED ORDER — FENTANYL CITRATE (PF) 250 MCG/5ML IJ SOLN
INTRAMUSCULAR | Status: AC
  Filled 2015-04-14: qty 5

## 2015-04-14 MED ORDER — HEMOSTATIC AGENTS (NO CHARGE) OPTIME
TOPICAL | Status: DC | PRN
  Administered 2015-04-14: 1 via TOPICAL

## 2015-04-14 MED ORDER — ONDANSETRON HCL 4 MG/2ML IJ SOLN
4.0000 mg | Freq: Four times a day (QID) | INTRAMUSCULAR | Status: DC | PRN
  Administered 2015-04-14 – 2015-04-15 (×2): 4 mg via INTRAVENOUS
  Filled 2015-04-14 (×2): qty 2

## 2015-04-14 MED ORDER — NEOSTIGMINE METHYLSULFATE 10 MG/10ML IV SOLN
INTRAVENOUS | Status: AC
  Filled 2015-04-14: qty 1

## 2015-04-14 MED ORDER — FENTANYL CITRATE (PF) 100 MCG/2ML IJ SOLN
INTRAMUSCULAR | Status: DC | PRN
  Administered 2015-04-14: 50 ug via INTRAVENOUS
  Administered 2015-04-14: 100 ug via INTRAVENOUS
  Administered 2015-04-14: 75 ug via INTRAVENOUS
  Administered 2015-04-14: 25 ug via INTRAVENOUS

## 2015-04-14 MED ORDER — HYDROMORPHONE HCL 1 MG/ML IJ SOLN
0.2500 mg | INTRAMUSCULAR | Status: DC | PRN
  Administered 2015-04-14 (×2): 0.5 mg via INTRAVENOUS

## 2015-04-14 MED ORDER — KCL IN DEXTROSE-NACL 20-5-0.45 MEQ/L-%-% IV SOLN
INTRAVENOUS | Status: DC
Start: 2015-04-14 — End: 2015-04-15
  Administered 2015-04-14: 14:00:00 via INTRAVENOUS
  Filled 2015-04-14 (×5): qty 1000

## 2015-04-14 MED ORDER — ACETAMINOPHEN 325 MG PO TABS: 650.0000 mg | ORAL_TABLET | ORAL | Status: DC | PRN

## 2015-04-14 MED ORDER — HYDROCODONE-ACETAMINOPHEN 5-325 MG PO TABS
1.0000 | ORAL_TABLET | ORAL | Status: DC | PRN
  Administered 2015-04-14 – 2015-04-15 (×5): 2 via ORAL
  Filled 2015-04-14 (×5): qty 2

## 2015-04-14 MED ORDER — ARTIFICIAL TEARS OP OINT
TOPICAL_OINTMENT | OPHTHALMIC | Status: DC | PRN
  Administered 2015-04-14: 1 via OPHTHALMIC

## 2015-04-14 MED ORDER — ARTIFICIAL TEARS OP OINT
TOPICAL_OINTMENT | OPHTHALMIC | Status: AC
  Filled 2015-04-14: qty 3.5

## 2015-04-14 MED ORDER — PROMETHAZINE HCL 25 MG/ML IJ SOLN
6.2500 mg | INTRAMUSCULAR | Status: DC | PRN
  Administered 2015-04-14: 6.25 mg via INTRAVENOUS

## 2015-04-14 MED ORDER — GLYCOPYRROLATE 0.2 MG/ML IJ SOLN
INTRAMUSCULAR | Status: DC | PRN
  Administered 2015-04-14: .6 mg via INTRAVENOUS

## 2015-04-14 MED ORDER — DEXAMETHASONE SODIUM PHOSPHATE 10 MG/ML IJ SOLN
INTRAMUSCULAR | Status: DC | PRN
  Administered 2015-04-14: 4 mg via INTRAVENOUS

## 2015-04-14 MED ORDER — BUPROPION HCL ER (XL) 150 MG PO TB24
150.0000 mg | ORAL_TABLET | Freq: Every day | ORAL | Status: DC
  Administered 2015-04-15: 150 mg via ORAL
  Filled 2015-04-14: qty 1

## 2015-04-14 MED ORDER — ONDANSETRON HCL 4 MG/2ML IJ SOLN
INTRAMUSCULAR | Status: AC
  Filled 2015-04-14: qty 2

## 2015-04-14 MED ORDER — GLYCOPYRROLATE 0.2 MG/ML IJ SOLN
INTRAMUSCULAR | Status: AC
  Filled 2015-04-14: qty 3

## 2015-04-14 MED ORDER — HYDROMORPHONE HCL 1 MG/ML IJ SOLN
INTRAMUSCULAR | Status: AC
  Administered 2015-04-14: 1 mg via INTRAVENOUS
  Filled 2015-04-14: qty 1

## 2015-04-14 MED ORDER — ROCURONIUM BROMIDE 100 MG/10ML IV SOLN
INTRAVENOUS | Status: DC | PRN
  Administered 2015-04-14: 40 mg via INTRAVENOUS

## 2015-04-14 MED ORDER — EPHEDRINE SULFATE 50 MG/ML IJ SOLN
INTRAMUSCULAR | Status: DC | PRN
  Administered 2015-04-14: 10 mg via INTRAVENOUS
  Administered 2015-04-14: 5 mg via INTRAVENOUS
  Administered 2015-04-14: 10 mg via INTRAVENOUS

## 2015-04-14 MED ORDER — PROMETHAZINE HCL 25 MG/ML IJ SOLN
INTRAMUSCULAR | Status: AC
  Filled 2015-04-14: qty 1

## 2015-04-14 MED ORDER — 0.9 % SODIUM CHLORIDE (POUR BTL) OPTIME
TOPICAL | Status: DC | PRN
  Administered 2015-04-14: 1000 mL

## 2015-04-14 SURGICAL SUPPLY — 53 items
ATTRACTOMAT 16X20 MAGNETIC DRP (DRAPES) ×3 IMPLANT
BENZOIN TINCTURE PRP APPL 2/3 (GAUZE/BANDAGES/DRESSINGS) ×3 IMPLANT
BLADE SURG 10 STRL SS (BLADE) ×3 IMPLANT
BLADE SURG 15 STRL LF DISP TIS (BLADE) ×1 IMPLANT
BLADE SURG 15 STRL SS (BLADE) ×2
CANISTER SUCTION 2500CC (MISCELLANEOUS) ×3 IMPLANT
CHLORAPREP W/TINT 10.5 ML (MISCELLANEOUS) ×3 IMPLANT
CLIP TI MEDIUM 6 (CLIP) ×3 IMPLANT
CLIP TI WIDE RED SMALL 6 (CLIP) ×6 IMPLANT
CLOSURE WOUND 1/2 X4 (GAUZE/BANDAGES/DRESSINGS) ×1
CONT SPEC 4OZ CLIKSEAL STRL BL (MISCELLANEOUS) ×3 IMPLANT
COVER SURGICAL LIGHT HANDLE (MISCELLANEOUS) ×3 IMPLANT
DRAPE PED LAPAROTOMY (DRAPES) ×3 IMPLANT
DRAPE UTILITY XL STRL (DRAPES) ×3 IMPLANT
ELECT CAUTERY BLADE 6.4 (BLADE) ×3 IMPLANT
ELECT REM PT RETURN 9FT ADLT (ELECTROSURGICAL) ×3
ELECTRODE REM PT RTRN 9FT ADLT (ELECTROSURGICAL) ×1 IMPLANT
GAUZE SPONGE 2X2 8PLY STRL LF (GAUZE/BANDAGES/DRESSINGS) ×1 IMPLANT
GAUZE SPONGE 4X4 16PLY XRAY LF (GAUZE/BANDAGES/DRESSINGS) ×3 IMPLANT
GLOVE BIO SURGEON STRL SZ7 (GLOVE) ×3 IMPLANT
GLOVE BIOGEL PI IND STRL 7.0 (GLOVE) ×1 IMPLANT
GLOVE BIOGEL PI IND STRL 7.5 (GLOVE) ×1 IMPLANT
GLOVE BIOGEL PI INDICATOR 7.0 (GLOVE) ×2
GLOVE BIOGEL PI INDICATOR 7.5 (GLOVE) ×2
GLOVE ECLIPSE 7.5 STRL STRAW (GLOVE) ×3 IMPLANT
GLOVE SURG ORTHO 8.0 STRL STRW (GLOVE) ×3 IMPLANT
GOWN STRL REUS W/ TWL LRG LVL3 (GOWN DISPOSABLE) ×2 IMPLANT
GOWN STRL REUS W/ TWL XL LVL3 (GOWN DISPOSABLE) ×1 IMPLANT
GOWN STRL REUS W/TWL LRG LVL3 (GOWN DISPOSABLE) ×4
GOWN STRL REUS W/TWL XL LVL3 (GOWN DISPOSABLE) ×2
HEMOSTAT SURGICEL 2X4 FIBR (HEMOSTASIS) ×3 IMPLANT
KIT BASIN OR (CUSTOM PROCEDURE TRAY) ×3 IMPLANT
KIT ROOM TURNOVER OR (KITS) ×3 IMPLANT
NEEDLE HYPO 25GX1X1/2 BEV (NEEDLE) ×3 IMPLANT
NS IRRIG 1000ML POUR BTL (IV SOLUTION) ×3 IMPLANT
PACK SURGICAL SETUP 50X90 (CUSTOM PROCEDURE TRAY) ×3 IMPLANT
PAD ARMBOARD 7.5X6 YLW CONV (MISCELLANEOUS) ×3 IMPLANT
PEN SKIN MARKING BROAD (MISCELLANEOUS) ×3 IMPLANT
PENCIL BUTTON HOLSTER BLD 10FT (ELECTRODE) ×3 IMPLANT
SPONGE GAUZE 2X2 STER 10/PKG (GAUZE/BANDAGES/DRESSINGS) ×2
SPONGE INTESTINAL PEANUT (DISPOSABLE) ×3 IMPLANT
STRIP CLOSURE SKIN 1/2X4 (GAUZE/BANDAGES/DRESSINGS) ×2 IMPLANT
SUT MNCRL AB 4-0 PS2 18 (SUTURE) ×3 IMPLANT
SUT SILK 2 0 (SUTURE) ×2
SUT SILK 2-0 18XBRD TIE 12 (SUTURE) ×1 IMPLANT
SUT SILK 3 0 (SUTURE)
SUT SILK 3-0 18XBRD TIE 12 (SUTURE) IMPLANT
SUT VIC AB 3-0 SH 18 (SUTURE) ×3 IMPLANT
SYR CONTROL 10ML LL (SYRINGE) ×3 IMPLANT
TOWEL OR 17X24 6PK STRL BLUE (TOWEL DISPOSABLE) ×3 IMPLANT
TOWEL OR 17X26 10 PK STRL BLUE (TOWEL DISPOSABLE) ×3 IMPLANT
TUBE CONNECTING 12'X1/4 (SUCTIONS) ×1
TUBE CONNECTING 12X1/4 (SUCTIONS) ×2 IMPLANT

## 2015-04-14 NOTE — Progress Notes (Signed)
Pt snoring ,wakes self up, states she is hurting, desats and encouraged  to deep breath, returns to sleep

## 2015-04-14 NOTE — Op Note (Signed)
OPERATIVE REPORT - PARATHYROIDECTOMY  Preoperative diagnosis: Primary hyperparathyroidism  Postop diagnosis: Same  Procedure: Right inferior minimally invasive parathyroidectomy  Surgeon:  Earnstine Regal, MD, FACS  Anesthesia: Gen. endotracheal  Estimated blood loss: Minimal  Preparation: ChloraPrep  Indications: The patient is a 54 year old female who presents with a parathyroid neoplasm. Patient is referred by Lennie Odor, PA-C, for evaluation of hypercalcemia and suspected primary hyperparathyroidism. Patient was noted on routine laboratory studies to have hypercalcemia with levels running between 10.1 and 11.2. PTH determination is also elevated between 71 and 81. Vitamin D levels are reportedly low and the patient is taking vitamin D supplements. Patient has documented osteopenia on previous bone density scan performed several years ago. Patient denies any history of nephrolithiasis. She has had no recent fractures. She does complain of chronic fatigue and depression. Patient underwent a nuclear medicine parathyroid scan in early March 2016. While this demonstrated some increase uptake on the right side of the thyroid bed, there was no focal activity to localize a parathyroid adenoma. Patient is now referred for further evaluation and recommendations. There is no family history of endocrine neoplasms. Patient has had a previous cervical disc surgery through an anterior approach. USN localizes a right inferior nodule consistent with parathyroid adenoma.  Procedure: Patient was prepared in the holding area. He was brought to operating room and placed in a supine position on the operating room table. Following administration of general anesthesia, the patient was positioned and then prepped and draped in the usual strict aseptic fashion. After ascertaining that an adequate level of anesthesia been achieved, a neck incision was made with a #15 blade. Dissection was carried through  subcutaneous tissues and platysma. Hemostasis was obtained with the electrocautery. Skin flaps were developed circumferentially and a Weitlander retractor was placed for exposure.  Strap muscles were incised in the midline. Strap muscles were reflected exposing the thyroid lobe. With gentle blunt dissection the thyroid lobe was mobilized.  Dissection was carried through adipose tissue and an enlarged parathyroid gland was identified. It was gently mobilized. Vascular structures were divided between small and medium ligaclips. Care was taken to avoid the recurrent laryngeal nerve and the esophagus. The parathyroid gland was completely excised. It was submitted to pathology where frozen section confirmed parathyroid tissue consistent with adenoma.  Neck was irrigated with warm saline and good hemostasis was noted. Fibrillar was placed in the operative field. Strap muscles were reapproximated in the midline with interrupted 3-0 Vicryl sutures. Platysma was closed with interrupted 3-0 Vicryl sutures. Skin was closed with a running 4-0 Monocryl subcuticular suture. Marcaine was infiltrated circumferentially. Wound was washed and dried and benzoin and Steri-Strips were applied. Sterile gauze dressings were applied. Patient was awakened from anesthesia and brought to the recovery room. The patient tolerated the procedure well.   Earnstine Regal, MD, [facility]

## 2015-04-14 NOTE — Anesthesia Postprocedure Evaluation (Signed)
  Anesthesia Post-op Note  Patient: Michele Townsend  Procedure(s) Performed: Procedure(s): RIGHT INFERIOR PARATHYROIDECTOMY (Right)  Patient Location: PACU  Anesthesia Type:General  Level of Consciousness: awake and alert   Airway and Oxygen Therapy: Patient Spontanous Breathing  Post-op Pain: mild  Post-op Assessment: Post-op Vital signs reviewed  Post-op Vital Signs: stable  Last Vitals:  Filed Vitals:   04/14/15 1000  BP: 111/61  Pulse: 60  Temp:   Resp: 24    Complications: No apparent anesthesia complications

## 2015-04-14 NOTE — Anesthesia Preprocedure Evaluation (Signed)
Anesthesia Evaluation  Patient identified by MRN, date of birth, ID band Patient awake    Reviewed: Allergy & Precautions, NPO status   Airway Mallampati: II       Dental  (+) Edentulous Upper   Pulmonary Current Smoker,  breath sounds clear to auscultation        Cardiovascular negative cardio ROS  Rhythm:Regular Rate:Normal     Neuro/Psych    GI/Hepatic Neg liver ROS, GERD-  ,  Endo/Other    Renal/GU negative Renal ROS     Musculoskeletal negative musculoskeletal ROS (+)   Abdominal (+) + obese,   Peds  Hematology   Anesthesia Other Findings   Reproductive/Obstetrics                             Anesthesia Physical Anesthesia Plan  ASA: II  Anesthesia Plan: General   Post-op Pain Management:    Induction: Intravenous  Airway Management Planned: Oral ETT  Additional Equipment:   Intra-op Plan:   Post-operative Plan: Extubation in OR  Informed Consent: I have reviewed the patients History and Physical, chart, labs and discussed the procedure including the risks, benefits and alternatives for the proposed anesthesia with the patient or authorized representative who has indicated his/her understanding and acceptance.     Plan Discussed with: CRNA and Surgeon  Anesthesia Plan Comments:         Anesthesia Quick Evaluation

## 2015-04-14 NOTE — Transfer of Care (Signed)
Immediate Anesthesia Transfer of Care Note  Patient: Michele Townsend  Procedure(s) Performed: Procedure(s): RIGHT INFERIOR PARATHYROIDECTOMY (Right)  Patient Location: PACU  Anesthesia Type:General  Level of Consciousness: awake, alert , oriented and patient cooperative  Airway & Oxygen Therapy: Patient Spontanous Breathing  Post-op Assessment: Report given to RN and Post -op Vital signs reviewed and stable  Post vital signs: Reviewed and stable  Last Vitals:  Filed Vitals:   04/14/15 0844  BP: 131/76  Pulse: 86  Temp: 36.6 C  Resp: 25    Complications: No apparent anesthesia complications

## 2015-04-14 NOTE — H&P (Signed)
General Surgery [facility]  Michele Townsend DOB: 08/24/1961 Separated / Language: Michele Townsend / Race: White Female  History of Present Illness  The patient is a 54 year old female who presents with a parathyroid neoplasm. Patient is referred by Michele Odor, PA-C, for evaluation of hypercalcemia and suspected primary hyperparathyroidism. Patient was noted on routine laboratory studies to have hypercalcemia with levels running between 10.1 and 11.2. PTH determination is also elevated between 71 and 81. Vitamin D levels are reportedly low and the patient is taking vitamin D supplements. Patient has documented osteopenia on previous bone density scan performed several years ago. Patient denies any history of nephrolithiasis. She has had no recent fractures. She does complain of chronic fatigue and depression. Patient underwent a nuclear medicine parathyroid scan in early March 2016. While this demonstrated some increase uptake on the right side of the thyroid bed, there was no focal activity to localize a parathyroid adenoma. Patient is now referred for further evaluation and recommendations. There is no family history of endocrine neoplasms. Patient has had a previous cervical disc surgery through an anterior approach.   Other Problems Anxiety Disorder Back Pain Depression Hypercholesterolemia Migraine Headache Sleep Apnea Thyroid Disease  Past Surgical History Foot Surgery Left. Hysterectomy (not due to cancer) - Partial Oral Surgery Tonsillectomy  Diagnostic Studies History Colonoscopy 1-5 years ago Mammogram 1-3 years ago Pap Smear 1-5 years ago  Allergies No Known Drug Allergies03/29/2016  Medication History Xanax XR (0.5MG  Tablet ER 24HR, Oral as needed) Active. Lipitor (40MG  Tablet, Oral) Active. Estrace (1MG  Tablet, Oral) Active. Pepcid AC (10MG  Tablet Chewable, Oral) Active. Pepcid AC (10MG  Tablet, Oral)  Active. Bupropion HCl (Smoking Deter) (150MG  Tablet ER, Oral) Active. MiraLax (Oral) Active. Vitamin D (Cholecalciferol) (1000UNIT Tablet, Oral) Active. Hydrochlorothiazide (100MG  Tablet, Oral) Active. Mobic (15MG  Tablet, Oral) Active. Zonegran (25MG  Capsule, Oral) Active.  Social History Alcohol use Occasional alcohol use. Caffeine use Carbonated beverages, Tea. No drug use Tobacco use Current every day smoker.  Family History  Depression Father, Mother. Hypertension Mother. Respiratory Condition Father.  Pregnancy / Birth History Age at menarche 80 years. Gravida 1 Maternal age 69-25 Para 1  Review of Systems General Present- Fatigue, Night Sweats and Weight Gain. Not Present- Appetite Loss, Chills, Fever and Weight Loss. Skin Present- Dryness. Not Present- Change in Wart/Mole, Hives, Jaundice, New Lesions, Non-Healing Wounds, Rash and Ulcer. HEENT Present- Sinus Pain, Sore Throat and Wears glasses/contact lenses. Not Present- Earache, Hearing Loss, Hoarseness, Nose Bleed, Oral Ulcers, Ringing in the Ears, Seasonal Allergies, Visual Disturbances and Yellow Eyes. Breast Not Present- Breast Mass, Breast Pain, Nipple Discharge and Skin Changes. Cardiovascular Present- Difficulty Breathing Lying Down, Leg Cramps, Shortness of Breath and Swelling of Extremities. Not Present- Chest Pain, Palpitations and Rapid Heart Rate. Gastrointestinal Present- Abdominal Pain, Bloating, Constipation, Difficulty Swallowing, Gets full quickly at meals, Indigestion, Nausea and Vomiting. Not Present- Bloody Stool, Change in Bowel Habits, Chronic diarrhea, Excessive gas, Hemorrhoids and Rectal Pain. Female Genitourinary Present- Frequency and Nocturia. Not Present- Painful Urination, Pelvic Pain and Urgency. Musculoskeletal Present- Joint Pain, Joint Stiffness, Muscle Pain, Muscle Weakness and Swelling of Extremities. Not Present- Back Pain. Neurological Present- Headaches, Numbness,  Tingling and Weakness. Not Present- Decreased Memory, Fainting, Seizures, Tremor and Trouble walking. Psychiatric Present- Anxiety, Depression and Frequent crying. Not Present- Bipolar, Change in Sleep Pattern and Fearful. Endocrine Present- Cold Intolerance, Heat Intolerance and Hot flashes. Not Present- Excessive Hunger, Hair Changes and New Diabetes. Hematology Present- Easy Bruising and Gland problems.  Not Present- Excessive bleeding, HIV and Persistent Infections.   Vitals 03/11/2015 9:01 AM Weight: 223.38 lb Height: 65in Body Surface Area: 2.16 m Body Mass Index: 37.17 kg/m Temp.: 98.60F  Pulse: 84 (Regular)  BP: 138/92 (Sitting, Left Arm, Standard)    Physical Exam  General - appears comfortable, no distress; not diaphorectic  HEENT - normocephalic; sclerae clear, gaze conjugate; mucous membranes moist, dentition good; voice normal  Neck - symmetric on extension; no palpable anterior or posterior cervical adenopathy; no palpable masses in the thyroid bed; well-healed surgical incision right anterior neck  Chest - clear bilaterally with rhonchi, rales, or wheeze  Cor - regular rhythm with normal rate; no significant murmur  Ext - non-tender without significant edema or lymphedema  Neuro - grossly intact; no tremor    Assessment & Plan  HYPERCALCEMIA (275.42  E83.52) PRIMARY HYPERPARATHYROIDISM (252.01  E21.0)  Patient presents with hypercalcemia and suspected primary hyperparathyroidism. Laboratories are consistent with this diagnosis. However the nuclear medicine parathyroid scan is negative for parathyroid adenoma.  I provided the patient with written literature on parathyroid disease to review at home.  USN was positive for right inferior adenoma.  24 hr urine calcium was significantly elevated.  Plan right inferior parathyroidectomy.  The risks and benefits of the procedure have been discussed at length with the patient.  The patient understands  the proposed procedure, potential alternative treatments, and the course of recovery to be expected.  All of the patient's questions have been answered at this time.  The patient wishes to proceed with surgery.  Earnstine Regal, MD, [facility] Office: [phone]

## 2015-04-15 ENCOUNTER — Encounter (HOSPITAL_COMMUNITY): Payer: Self-pay | Admitting: Surgery

## 2015-04-15 DIAGNOSIS — D351 Benign neoplasm of parathyroid gland: Secondary | ICD-10-CM | POA: Diagnosis not present

## 2015-04-15 LAB — BASIC METABOLIC PANEL
Anion gap: 8 (ref 5–15)
BUN: 8 mg/dL (ref 6–20)
CALCIUM: 8.5 mg/dL — AB (ref 8.9–10.3)
CO2: 26 mmol/L (ref 22–32)
Chloride: 105 mmol/L (ref 101–111)
Creatinine, Ser: 0.84 mg/dL (ref 0.44–1.00)
Glucose, Bld: 107 mg/dL — ABNORMAL HIGH (ref 70–99)
POTASSIUM: 3.8 mmol/L (ref 3.5–5.1)
Sodium: 139 mmol/L (ref 135–145)

## 2015-04-15 MED ORDER — OXYCODONE HCL 5 MG PO TABS: 5.0000 mg | ORAL_TABLET | Freq: Four times a day (QID) | ORAL | Status: DC | PRN

## 2015-04-15 MED ORDER — OXYCODONE HCL 5 MG PO TABS
10.0000 mg | ORAL_TABLET | ORAL | Status: DC | PRN
  Administered 2015-04-15: 10 mg via ORAL
  Filled 2015-04-15: qty 2

## 2015-04-15 NOTE — Discharge Summary (Signed)
  Physician Discharge Summary [facility]  Patient ID: Michele Townsend MRN: [id number] DOB/AGE: 1961-03-02 54 y.o.  Admit date: 04/14/2015 Discharge date: 04/15/2015  Admission Diagnoses:  Primary hyperparathyroidism  Discharge Diagnoses:  Principal Problem:   Hyperparathyroidism, primary Active Problems:   Primary hyperparathyroidism   Discharged Condition: good  Hospital Course: Patient was admitted for observation following parathyroid surgery.  Post op course was uncomplicated.  Pain was well controlled.  Tolerated diet.  Post op calcium level on morning following surgery was 8.5 mg/dl.  Patient was prepared for discharge home on POD#1.  Consults: None  Treatments: surgery: right inferior parathyroidectomy  Discharge Exam: Blood pressure 95/45, pulse 60, temperature 98 F (36.7 C), temperature source Oral, resp. rate 20, height 5\' 5"  (1.651 m), weight 100.245 kg (221 lb), SpO2 99 %. HEENT - clear Neck - wound dry and intact; minimal STS Chest - clear bilaterally Cor - RRR  Disposition: Home     Medication List    ASK your doctor about these medications        ALPRAZolam 0.5 MG tablet  Commonly known as:  XANAX  Take 0.5 mg by mouth 2 (two) times daily as needed. For anxiety     atorvastatin 40 MG tablet  Commonly known as:  LIPITOR  Take 40 mg by mouth daily.     azithromycin 250 MG tablet  Commonly known as:  ZITHROMAX Z-PAK  2 po day one, then 1 daily x 4 days     buPROPion 150 MG 24 hr tablet  Commonly known as:  WELLBUTRIN XL  Take 150 mg by mouth daily.     cholecalciferol 1000 UNITS tablet  Commonly known as:  VITAMIN D  Take 2,000 Units by mouth daily.     estradiol 1 MG tablet  Commonly known as:  ESTRACE  Take 1 mg by mouth daily.     hydrochlorothiazide 25 MG tablet  Commonly known as:  HYDRODIURIL  Take 25 mg by mouth daily.     meloxicam 15 MG tablet  Commonly known as:  MOBIC  Take 15 mg by mouth daily.     PARoxetine 20 MG tablet  Commonly known as:  PAXIL  Take 20 mg by mouth daily.     polyethylene glycol packet  Commonly known as:  MIRALAX / GLYCOLAX  Take 17 g by mouth daily as needed for mild constipation.     zonisamide 25 MG capsule  Commonly known as:  ZONEGRAN  Take 100 mg by mouth at bedtime.         Earnstine Regal, MD, [facility] Office: [phone]   Signed: Earnstine Regal 04/15/2015, 7:38 AM

## 2015-04-15 NOTE — Discharge Instructions (Signed)
Parathyroidectomy °A parathyroidectomy is surgery to remove one or more parathyroid glands. These glands produce a hormone (parathyroid hormone) that helps control the level of calcium in your body. The glands are very small, about the size of a pea. They are located in your neck, close to your thyroid gland and your Adam's apple. Most people (85%) have four parathyroid glands,some people may have one or two more than that. °Hyperparathyroidism is when too much parathyroid hormone is being produced. Usually this is caused by one of the parathyroid glands becoming enlarged, but it can also be caused by more than one of the glands. Hyperparathyroidism is found during blood tests that show high calcium in the blood. Parathyroid hormone levels will also be elevated. Cancer also can cause hyperparathyroidism, but this is rare. For the most common type of hyperparathyroidism, the treatment is surgical removal of the parathyroid gland that is enlarged. For patients with kidney failure and hyperparathyroidism, other treatment will be tried before surgery is done on the parathyroid.  °Many times x-ray studies are done to find out which parathyroid gland or glands is malfunctioning. The decision about the best treatment for hyperparathyroidism is between the patient, their primary doctor, an endocrinologist, and a surgeon experienced in parathyroid surgery. °LET YOUR CAREGIVER KNOW ABOUT: °· Any allergies. °· All medications you are taking, including: °¨ Herbs, eyedrops, over-the-counter medications and creams. °¨ Blood thinners (anticoagulants), aspirin or other drugs that could affect blood clotting. °· Use of steroids (by mouth or as creams). °· Previous problems with anesthetics, including local anesthetics. °· Possibility of pregnancy, if this applies. °· Any history of blood clots. °· Any history of bleeding or other blood problems. °· Previous surgery. °· Smoking history. °· Other health problems. °RISKS AND  COMPLICATIONS  °· Short-term possibilities include: °¨ Excessive bleeding. °¨ Pain. °¨ Infection near the incision. °¨ Slow healing. °¨ Pooling of blood under the wound (hematoma). °¨ Damage to nerves in your neck. °¨ Blood clots. °¨ Difficulty breathing. This is very rare. It also is almost always temporary. °· Longer-term possibilities include: °¨ Scarring. °¨ Skin damage. °¨ Damage to blood vessels in the area. °¨ Need for additional surgery. °¨ A hoarse or weak voice. This is usually temporary. It can be the result of nerve damage. °¨ Development of hypoparathyroidism. This means you are not making enough parathyroid hormone. It is rare. If it occurs, you will need to take calcium supplements daily. °BEFORE THE PROCEDURE °· Sometimes the surgery is done on an outpatient basis. This means you could go home the same day as your surgery. Other times, people need to stay in the hospital overnight. Ask your surgeon what you should expect. °· If your surgery will be an outpatient procedure, arrange for someone to drive you home after the surgery. °· Two weeks before your surgery, stop using aspirin and non-steroidal anti-inflammatory drugs (NSAID's) for pain relief. This includes prescription drugs and over-the-counter drugs such as ibuprofen and naproxen. Also stop taking vitamin E. °· If you take blood-thinners, ask your healthcare provider when you should stop taking them. °· Do not eat or drink for about 8 hours before your surgery. °· You might be asked to shower or wash with a special antibacterial soap before the procedure. °· Arrive at least an hour before the surgery, or whenever your surgeon recommends. This will give you time to check in and fill out any needed paperwork. °PROCEDURE °· The preparation: °¨ You will change into a hospital gown. °¨ You   will be given an IV. A needle will be inserted in your arm. Medication will be able to flow directly into your body through this needle. °¨ You might be given a  sedative to help you relax. °¨ You will be given a drug that puts you to sleep during the surgery (general anesthetic). °· The procedure: °¨ Once you are asleep, the surgeon will make a small cut (incision) in your lower neck. Ask your surgeon where the incision will be. °¨ The surgeon will look for the gland(s) that are not working well. Often a tissue sample from a gland is used to determine this. °¨ Any glands that are not working well will be removed. °¨ The surgeon will close the incision with stitches, often these are hidden under the skin. °AFTER THE PROCEDURE °· You will stay in a recovery area until the anesthesia has worn off. Your blood pressure and heart rate will be checked. °· If your surgery was an outpatient procedure, you will go home the same day. °· If you need to stay in the hospital, you will be moved to a hospital room. You will probably stay for two to three days. This will depend on how quickly you recover. °· While you are in the hospital, your blood will be tested to check the calcium levels in your body. °HOME CARE INSTRUCTIONS  °· Take any medication that your surgeon prescribes. Follow the directions carefully. Take all of the medication. °· Ask your surgeon whether you can take over-the-counter medicines for pain, discomfort or fever. Do not take aspirin without permission from the surgeon. Aspirin increases the chances of bleeding. °· Do not get the wound wet for the first few days after surgery (or until the surgeon tells you it is OK). °· After this procedure, many patients may develop low calcium levels in the blood. It is critical that you see your medical caregiver to have this monitored and managed. °·  °·  °SEEK MEDICAL CARE IF:  °· You notice blood or fluid leaking from the wound, or it becomes red or swollen. °· You have trouble breathing. °· You have trouble speaking. °· You become nauseous or throw up for more than two days after the surgery. °· You have a fever or  persistent symptoms for more than 2-3 days. °SEEK IMMEDIATE MEDICAL CARE IF:  °· Breathing becomes more difficult. °· You have a fever and your symptoms suddenly get worse. °Document Released: 02/25/2009 Document Revised: 11/15/2012 Document Reviewed: 02/25/2009 °[employer]® Patient Information ©2015 [employer]. This information is not intended to replace advice given to you by your health care provider. Make sure you discuss any questions you have with your health care provider. ° °

## 2015-08-12 ENCOUNTER — Ambulatory Visit: Payer: 59 | Admitting: Internal Medicine

## 2015-09-11 ENCOUNTER — Ambulatory Visit: Payer: 59 | Admitting: Internal Medicine

## 2015-09-16 ENCOUNTER — Encounter: Payer: Self-pay | Admitting: Internal Medicine

## 2015-10-15 ENCOUNTER — Emergency Department (HOSPITAL_COMMUNITY): Payer: Self-pay

## 2015-10-15 ENCOUNTER — Emergency Department (HOSPITAL_COMMUNITY)
Admission: EM | Admit: 2015-10-15 | Discharge: 2015-10-16 | Disposition: A | Discharge status: Home / Self Care | Payer: Self-pay | Attending: Emergency Medicine | Admitting: Emergency Medicine

## 2015-10-15 ENCOUNTER — Encounter (HOSPITAL_COMMUNITY): Payer: Self-pay | Admitting: Emergency Medicine

## 2015-10-15 DIAGNOSIS — R51 Headache: Secondary | ICD-10-CM

## 2015-10-15 DIAGNOSIS — R519 Headache, unspecified: Secondary | ICD-10-CM

## 2015-10-15 DIAGNOSIS — Z8719 Personal history of other diseases of the digestive system: Secondary | ICD-10-CM | POA: Insufficient documentation

## 2015-10-15 DIAGNOSIS — G43909 Migraine, unspecified, not intractable, without status migrainosus: Secondary | ICD-10-CM | POA: Insufficient documentation

## 2015-10-15 DIAGNOSIS — Z79899 Other long term (current) drug therapy: Secondary | ICD-10-CM | POA: Insufficient documentation

## 2015-10-15 DIAGNOSIS — Z720 Tobacco use: Secondary | ICD-10-CM | POA: Insufficient documentation

## 2015-10-15 DIAGNOSIS — Z791 Long term (current) use of non-steroidal anti-inflammatories (NSAID): Secondary | ICD-10-CM | POA: Insufficient documentation

## 2015-10-15 DIAGNOSIS — E785 Hyperlipidemia, unspecified: Secondary | ICD-10-CM | POA: Insufficient documentation

## 2015-10-15 DIAGNOSIS — F329 Major depressive disorder, single episode, unspecified: Secondary | ICD-10-CM | POA: Insufficient documentation

## 2015-10-15 DIAGNOSIS — R0602 Shortness of breath: Secondary | ICD-10-CM | POA: Insufficient documentation

## 2015-10-15 DIAGNOSIS — M25512 Pain in left shoulder: Secondary | ICD-10-CM | POA: Insufficient documentation

## 2015-10-15 DIAGNOSIS — M25511 Pain in right shoulder: Secondary | ICD-10-CM | POA: Insufficient documentation

## 2015-10-15 DIAGNOSIS — M549 Dorsalgia, unspecified: Secondary | ICD-10-CM | POA: Insufficient documentation

## 2015-10-15 DIAGNOSIS — R52 Pain, unspecified: Secondary | ICD-10-CM

## 2015-10-15 LAB — I-STAT TROPONIN, ED: TROPONIN I, POC: 0 ng/mL (ref 0.00–0.08)

## 2015-10-15 LAB — CBC
HEMATOCRIT: 45.3 % (ref 36.0–46.0)
HEMOGLOBIN: 15.3 g/dL — AB (ref 12.0–15.0)
MCH: 32.7 pg (ref 26.0–34.0)
MCHC: 33.8 g/dL (ref 30.0–36.0)
MCV: 96.8 fL (ref 78.0–100.0)
Platelets: 215 10*3/uL (ref 150–400)
RBC: 4.68 MIL/uL (ref 3.87–5.11)
RDW: 12.7 % (ref 11.5–15.5)
WBC: 10.3 10*3/uL (ref 4.0–10.5)

## 2015-10-15 LAB — BASIC METABOLIC PANEL
Anion gap: 9 (ref 5–15)
BUN: 16 mg/dL (ref 6–20)
CALCIUM: 9.6 mg/dL (ref 8.9–10.3)
CO2: 25 mmol/L (ref 22–32)
Chloride: 107 mmol/L (ref 101–111)
Creatinine, Ser: 0.9 mg/dL (ref 0.44–1.00)
GFR calc Af Amer: >60 mL/min (ref >60)
GFR calc non Af Amer: >60 mL/min (ref >60)
GLUCOSE: 104 mg/dL — AB (ref 65–99)
POTASSIUM: 4.4 mmol/L (ref 3.5–5.1)
Sodium: 141 mmol/L (ref 135–145)

## 2015-10-15 MED ORDER — DIPHENHYDRAMINE HCL 50 MG/ML IJ SOLN
50.0000 mg | Freq: Once | INTRAMUSCULAR | Status: AC
  Administered 2015-10-16: 50 mg via INTRAVENOUS
  Filled 2015-10-15: qty 1

## 2015-10-15 MED ORDER — METOCLOPRAMIDE HCL 5 MG/ML IJ SOLN
10.0000 mg | Freq: Once | INTRAMUSCULAR | Status: AC
  Administered 2015-10-16: 10 mg via INTRAVENOUS
  Filled 2015-10-15: qty 2

## 2015-10-15 MED ORDER — PROCHLORPERAZINE EDISYLATE 5 MG/ML IJ SOLN
10.0000 mg | Freq: Once | INTRAMUSCULAR | Status: AC
  Administered 2015-10-16: 10 mg via INTRAVENOUS
  Filled 2015-10-15: qty 2

## 2015-10-15 MED ORDER — SODIUM CHLORIDE 0.9 % IV BOLUS (SEPSIS)
1000.0000 mL | Freq: Once | INTRAVENOUS | Status: AC
  Administered 2015-10-16: 1000 mL via INTRAVENOUS

## 2015-10-15 NOTE — ED Notes (Addendum)
The patient said she started having a headache two weeks ago.  She has a history of migraines and took her migraine medication and it is not working.  She also said a week ago she started haiving back pain that started in her shoulders all the way down.  Today she started having SOB and it scared her so she decided to come to the ED.  She rates her pain 8/10. She has been nauseated but has not vomited.  She is also diaphoretic.  The patient does admit to smoking.

## 2015-10-15 NOTE — ED Provider Notes (Signed)
CSN: [id number]     Arrival date & time 10/15/15  1936 History  By signing my name below, I, Meriel Pica, attest that this documentation has been prepared under the direction and in the presence of Everlene Balls, MD. Electronically Signed: Meriel Pica, ED Scribe. 10/15/2015. 11:49 PM.   Chief Complaint  Patient presents with  . Shortness of Breath    The patient said she started having a headache two weeks ago.  She has a history of migraines and took her migraine medication and it is not working.  She also said a week ago she started haiving back pain.    . Back Pain  . Migraine   The history is provided by the patient. No language interpreter was used.   HPI Comments: Michele Townsend is a 54 y.o. female, with a PMhx of migraines, sciatica, and HLD, who is a current smoker, presents to the Emergency Department complaining of a constant, throbbing headache that has been present for 2 weeks and is worse than her normal migraines. She has been taking home medication prescribed for her PMhx of migraines without significant relief; she is unable to recall the name of this medication. Pt also c/o constant, sharp bilateral posterior shoulder pain that radiates down her entire back X 1 week and is not typically associated with her migraines. She endorses a PMhx of sciatica and reports this back pain is different than her sciatica. Additionally she complains of SOB but endorses smoking daily. Pt denies any fevers or recent illness.   Past Medical History  Diagnosis Date  . Migraine   . HLD (hyperlipidemia)   . Depression   . IBS (irritable bowel syndrome)   . Current smoker   . Sleep apnea   . GERD (gastroesophageal reflux disease)    Past Surgical History  Procedure Laterality Date  . Tubal ligation    . Partial hysterectomy    . Abdominal hysterectomy    . Parathyroidectomy Right 04/14/2015  . Parathyroidectomy Right 04/14/2015    Procedure: RIGHT INFERIOR PARATHYROIDECTOMY;  Surgeon:  Armandina Gemma, MD;  Location: [facility];  Service: General;  Laterality: Right;   History reviewed. No pertinent family history. Social History  Substance Use Topics  . Smoking status: Current Every Day Smoker -- 0.50 packs/day    Types: Cigarettes  . Smokeless tobacco: Never Used  . Alcohol Use: No   OB History    No data available     Review of Systems  Constitutional: Negative for fever.  HENT: Negative for congestion and rhinorrhea.   Respiratory: Positive for shortness of breath. Negative for cough.   Musculoskeletal: Positive for back pain and arthralgias ( posterior shoulders bilaterally).  Neurological: Positive for headaches.  A complete 10 system review of systems was obtained and is otherwise negative except at noted in the HPI and PMH.  Allergies  Review of patient's allergies indicates no known allergies.  Home Medications   Prior to Admission medications   Medication Sig Start Date End Date Taking? Authorizing Provider  acetaminophen (TYLENOL) 500 MG tablet Take 1,000 mg by mouth every 6 (six) hours as needed for moderate pain.   Yes Historical Provider, MD  ALPRAZolam Duanne Moron) 0.5 MG tablet Take 0.5 mg by mouth 2 (two) times daily as needed. For anxiety   Yes Historical Provider, MD  atorvastatin (LIPITOR) 40 MG tablet Take 40 mg by mouth daily.   Yes Historical Provider, MD  buPROPion (WELLBUTRIN XL) 150 MG 24 hr tablet Take 150 mg  by mouth daily.   Yes Historical Provider, MD  cholecalciferol (VITAMIN D) 1000 UNITS tablet Take 2,000 Units by mouth daily.   Yes Historical Provider, MD  Dextromethorphan-Guaifenesin (CORICIDIN HBP CONGESTION/COUGH) 10-200 MG CAPS Take 2 capsules by mouth daily as needed (for congestion/cold).   Yes Historical Provider, MD  estradiol (ESTRACE) 1 MG tablet Take 1 mg by mouth daily.   Yes Historical Provider, MD  meloxicam (MOBIC) 15 MG tablet Take 15 mg by mouth daily.   Yes Historical Provider, MD  PARoxetine (PAXIL) 20 MG tablet Take 20 mg  by mouth daily.   Yes Historical Provider, MD  traMADol (ULTRAM) 50 MG tablet Take 50 mg by mouth daily.   Yes Historical Provider, MD  zonisamide (ZONEGRAN) 25 MG capsule Take 100 mg by mouth at bedtime.    Yes Historical Provider, MD  azithromycin (ZITHROMAX Z-PAK) 250 MG tablet 2 po day one, then 1 daily x 4 days Patient not taking: Reported on 04/02/2015 09/10/14   Veryl Speak, MD  hydrochlorothiazide (HYDRODIURIL) 25 MG tablet Take 25 mg by mouth daily.    Historical Provider, MD  oxyCODONE (OXY IR/ROXICODONE) 5 MG immediate release tablet Take 1-2 tablets (5-10 mg total) by mouth every 6 (six) hours as needed for moderate pain. 04/15/15   Armandina Gemma, MD   BP 116/70 mmHg  Pulse 80  Resp 16  SpO2 98% Physical Exam  Constitutional: She is oriented to person, place, and time. She appears well-developed and well-nourished. No distress.  HENT:  Head: Normocephalic and atraumatic.  Nose: Nose normal.  Mouth/Throat: Oropharynx is clear and moist. No oropharyngeal exudate.  Eyes: Conjunctivae and EOM are normal. Pupils are equal, round, and reactive to light. No scleral icterus.  Neck: Normal range of motion. Neck supple. No JVD present. No tracheal deviation present. No thyromegaly present.  Cardiovascular: Normal rate, regular rhythm and normal heart sounds.  Exam reveals no gallop and no friction rub.   No murmur heard. Pulmonary/Chest: Effort normal and breath sounds normal. No respiratory distress. She has no wheezes. She exhibits no tenderness.  Abdominal: Soft. Bowel sounds are normal. She exhibits no distension and no mass. There is no tenderness. There is no rebound and no guarding.  Musculoskeletal: Normal range of motion. She exhibits no edema or tenderness.  + straight leg raise test bilaterally.   Lymphadenopathy:    She has no cervical adenopathy.  Neurological: She is alert and oriented to person, place, and time. No cranial nerve deficit. She exhibits normal muscle tone.   Normal strength and sensation in all extremities. Normal cerebellar testing.  Skin: Skin is warm and dry. No rash noted. No erythema. No pallor.  Nursing note and vitals reviewed.   ED Course  Procedures  DIAGNOSTIC STUDIES: Oxygen Saturation is 98% on RA, normal by my interpretation.    COORDINATION OF CARE: 11:32 PM Discussed treatment plan with pt at bedside and pt agreed to plan.   Labs Review Labs Reviewed  BASIC METABOLIC PANEL - Abnormal; Notable for the following:    Glucose, Bld 104 (*)    All other components within normal limits  CBC - Abnormal; Notable for the following:    Hemoglobin 15.3 (*)    All other components within normal limits  I-STAT TROPOININ, ED    Imaging Review Dg Chest 2 View  10/15/2015  CLINICAL DATA:  Chest pain for 1 week. Shortness breath beginning today. EXAM: CHEST  2 VIEW COMPARISON:  09/10/2014 FINDINGS: The heart size and mediastinal  contours are within normal limits. Both lungs are clear. The visualized skeletal structures are unremarkable. IMPRESSION: No active cardiopulmonary disease. Electronically Signed   By: Earle Gell M.D.   On: 10/15/2015 20:40   Ct Angio Chest Aorta W/cm &/or Wo/cm  10/16/2015  CLINICAL DATA:  Back pain and chest pain with nausea. EXAM: CT ANGIOGRAPHY CHEST, ABDOMEN AND PELVIS TECHNIQUE: Multidetector CT imaging through the chest, abdomen and pelvis was performed using the standard protocol during bolus administration of intravenous contrast. Multiplanar reconstructed images and MIPs were obtained and reviewed to evaluate the vascular anatomy. CONTRAST:  149mL OMNIPAQUE IOHEXOL 350 MG/ML SOLN COMPARISON:  None. FINDINGS: CTA CHEST FINDINGS Noncontrast CT images of the chest obtained. No significant aortic calcification. No evidence of intramural hematoma. Images obtained during arterial phase of contrast administration demonstrate normal caliber thoracic aorta. No aortic dissection. Great vessel origins are patent.  Central pulmonary arteries are well opacified without evidence of significant pulmonary embolus. Normal heart size. Esophagus is decompressed. No significant lymphadenopathy in the chest. Calcification in the right thyroid gland without enlargement. Mild dependent changes in the lung bases. No focal airspace disease or consolidation in the lungs. No pneumothorax. No pleural effusions. Airways are patent. Review of the MIP images confirms the above findings. CTA ABDOMEN AND PELVIS FINDINGS Normal caliber abdominal aorta. No aortic dissection. The abdominal aorta, celiac axis, superior mesenteric artery, single left and duplicated right renal arteries, inferior mesenteric artery, and bilateral iliac, external iliac, internal iliac, and common femoral arteries are patent. Renal nephrograms are symmetrical. The liver, spleen, gallbladder, pancreas, adrenal glands, kidneys, inferior vena cava, and retroperitoneal lymph nodes are unremarkable. Stomach, small bowel, and colon are mostly decompressed. No free air or free fluid in the abdomen. Pelvis: Appendix is normal. Bladder is decompressed. Prostate gland is not enlarged. No free or loculated pelvic fluid collections. No pelvic mass or lymphadenopathy. No evidence of diverticulitis. Bones: Normal alignment of the thoracic and lumbar spine. No vertebral compression deformities. No destructive bone lesions appreciated. Review of the MIP images confirms the above findings. IMPRESSION: No evidence of aneurysm or dissection of the thoracic or abdominal aorta. No evidence of significant pulmonary embolus. No acute process suggested in the chest, abdomen, or pelvis. Electronically Signed   By: Lucienne Capers M.D.   On: 10/16/2015 01:18   Ct Angio Abd/pel W/ And/or W/o  10/16/2015  CLINICAL DATA:  Back pain and chest pain with nausea. EXAM: CT ANGIOGRAPHY CHEST, ABDOMEN AND PELVIS TECHNIQUE: Multidetector CT imaging through the chest, abdomen and pelvis was performed using  the standard protocol during bolus administration of intravenous contrast. Multiplanar reconstructed images and MIPs were obtained and reviewed to evaluate the vascular anatomy. CONTRAST:  110mL OMNIPAQUE IOHEXOL 350 MG/ML SOLN COMPARISON:  None. FINDINGS: CTA CHEST FINDINGS Noncontrast CT images of the chest obtained. No significant aortic calcification. No evidence of intramural hematoma. Images obtained during arterial phase of contrast administration demonstrate normal caliber thoracic aorta. No aortic dissection. Great vessel origins are patent. Central pulmonary arteries are well opacified without evidence of significant pulmonary embolus. Normal heart size. Esophagus is decompressed. No significant lymphadenopathy in the chest. Calcification in the right thyroid gland without enlargement. Mild dependent changes in the lung bases. No focal airspace disease or consolidation in the lungs. No pneumothorax. No pleural effusions. Airways are patent. Review of the MIP images confirms the above findings. CTA ABDOMEN AND PELVIS FINDINGS Normal caliber abdominal aorta. No aortic dissection. The abdominal aorta, celiac axis, superior mesenteric artery, single left  and duplicated right renal arteries, inferior mesenteric artery, and bilateral iliac, external iliac, internal iliac, and common femoral arteries are patent. Renal nephrograms are symmetrical. The liver, spleen, gallbladder, pancreas, adrenal glands, kidneys, inferior vena cava, and retroperitoneal lymph nodes are unremarkable. Stomach, small bowel, and colon are mostly decompressed. No free air or free fluid in the abdomen. Pelvis: Appendix is normal. Bladder is decompressed. Prostate gland is not enlarged. No free or loculated pelvic fluid collections. No pelvic mass or lymphadenopathy. No evidence of diverticulitis. Bones: Normal alignment of the thoracic and lumbar spine. No vertebral compression deformities. No destructive bone lesions appreciated. Review  of the MIP images confirms the above findings. IMPRESSION: No evidence of aneurysm or dissection of the thoracic or abdominal aorta. No evidence of significant pulmonary embolus. No acute process suggested in the chest, abdomen, or pelvis. Electronically Signed   By: Lucienne Capers M.D.   On: 10/16/2015 01:18   I have personally reviewed and evaluated these images and lab results as part of my medical decision-making.   EKG Interpretation   Date/Time:  Wednesday October 15 2015 19:53:16 EDT Ventricular Rate:  106 PR Interval:  156 QRS Duration: 82 QT Interval:  352 QTC Calculation: 467 R Axis:   75 Text Interpretation:  Sinus tachycardia Low voltage QRS Cannot rule out  Anterior infarct , age undetermined Abnormal ECG No significant change  since last tracing Confirmed by Glynn Octave [phone]) on 10/15/2015  11:00:27 PM      MDM   Final diagnoses:  Pain   Patient presents emergency department for headache for the past 2 weeks. She states this headache is consistent with her normal migraines, however she's having these symptoms with back pain, shoulder pain, bilateral leg pain. She does have history of sciatica, however I will evaluate for dissection with the symptoms. CT scan was ordered, patient was given Benadryl, Reglan, Compazine for treatment of her headache. I doubt serious intracranial pathology as this is going on for 2 weeks and was gradual in onset. In addition patient has normal neurological exam.  CT scan is negative for dissection. Upon repeat evaluation, patient feels better in regards to her headache. Primary care follow-up was advised. She appears well and in no acute distress, vital signs were within her normal limits and she is safe for discharge.   I personally performed the services described in this documentation, which was scribed in my presence. The recorded information has been reviewed and is accurate.      Everlene Balls, MD 10/16/15 [phone]

## 2015-10-15 NOTE — ED Notes (Signed)
Pt from home for eval of migraine x2 weeks, pt reports nausea and also reports light sensitivity. Pt states she has tried taking migraine medications with no relief. Pt reports intermittent sob that started today, denies any cp or productive cough. Pt alert and oriented, skin warm and dry. MAE presently.

## 2015-10-16 ENCOUNTER — Emergency Department (HOSPITAL_COMMUNITY): Payer: 59

## 2015-10-16 ENCOUNTER — Encounter (HOSPITAL_COMMUNITY): Payer: Self-pay | Admitting: Radiology

## 2015-10-16 MED ORDER — IOHEXOL 350 MG/ML SOLN
100.0000 mL | Freq: Once | INTRAVENOUS | Status: AC | PRN
  Administered 2015-10-16: 100 mL via INTRAVENOUS

## 2015-10-16 NOTE — ED Notes (Signed)
Patient transported to CT 

## 2015-10-16 NOTE — Discharge Instructions (Signed)
Recurrent Migraine Headache Michele Townsend, your CT scan did not show any cause for your back pain.  This may be your sciatica.  Continue to take your home migraine medication as prescribed and see her primary care physician within 3 days for close follow-up. If any symptoms worsen come back to the emergency department immediately. Thank you. A migraine headache is very bad, throbbing pain on one or both sides of your head. Recurrent migraines keep coming back. Talk to your doctor about what things may bring on (trigger) your migraine headaches. HOME CARE  Only take medicines as told by your doctor.  Lie down in a dark, quiet room when you have a migraine.  Keep a journal to find out if certain things bring on migraine headaches. For example, write down:  What you eat and drink.  How much sleep you get.  Any change to your diet or medicines.  Lessen how much alcohol you drink.  Quit smoking if you smoke.  Get enough sleep.  Lessen any stress in your life.  Keep lights dim if bright lights bother you or make your migraines worse. GET HELP IF:  Medicine does not help your migraines.  Your pain keeps coming back.  You have a fever. GET HELP RIGHT AWAY IF:   Your migraine becomes really bad.  You have a stiff neck.  You have trouble seeing.  Your muscles are weak, or you lose muscle control.  You lose your balance or have trouble walking.  You feel like you will pass out (faint), or you pass out.  You have really bad symptoms that are different than your first symptoms. MAKE SURE YOU:   Understand these instructions.  Will watch your condition.  Will get help right away if you are not doing well or get worse.   This information is not intended to replace advice given to you by your health care provider. Make sure you discuss any questions you have with your health care provider.   Document Released: 09/07/2008 Document Revised: 12/04/2013 Document Reviewed:  08/06/2013 Elsevier Interactive Patient Education 2016 [employer] Sciatica Sciatica is pain, weakness, numbness, or tingling along your sciatic nerve. The nerve starts in the lower back and runs down the back of each leg. Nerve damage or certain conditions pinch or put pressure on the sciatic nerve. This causes the pain, weakness, and other discomforts of sciatica. HOME CARE   Only take medicine as told by your doctor.  Apply ice to the affected area for 20 minutes. Do this 3-4 times a day for the first 48-72 hours. Then try heat in the same way.  Exercise, stretch, or do your usual activities if these do not make your pain worse.  Go to physical therapy as told by your doctor.  Keep all doctor visits as told.  Do not wear high heels or shoes that are not supportive.  Get a firm mattress if your mattress is too soft to lessen pain and discomfort. GET HELP RIGHT AWAY IF:   You cannot control when you poop (bowel movement) or pee (urinate).  You have more weakness in your lower back, lower belly (pelvis), butt (buttocks), or legs.  You have redness or puffiness (swelling) of your back.  You have a burning feeling when you pee.  You have pain that gets worse when you lie down.  You have pain that wakes you from your sleep.  Your pain is worse than past pain.  Your pain lasts longer than 4  weeks.  You are suddenly losing weight without reason. MAKE SURE YOU:   Understand these instructions.  Will watch this condition.  Will get help right away if you are not doing well or get worse.   This information is not intended to replace advice given to you by your health care provider. Make sure you discuss any questions you have with your health care provider.   Document Released: 09/07/2008 Document Revised: 08/20/2015 Document Reviewed: 04/09/2012 Elsevier Interactive Patient Education [employer].

## 2015-10-16 NOTE — ED Notes (Signed)
Pt understands discharge paper work and does not have any additional questions at this time.

## 2015-12-18 ENCOUNTER — Emergency Department (HOSPITAL_COMMUNITY)
Admission: EM | Admit: 2015-12-18 | Discharge: 2015-12-18 | Disposition: A | Discharge status: Home / Self Care | Payer: Self-pay | Attending: Emergency Medicine | Admitting: Emergency Medicine

## 2015-12-18 ENCOUNTER — Encounter (HOSPITAL_COMMUNITY): Payer: Self-pay | Admitting: *Deleted

## 2015-12-18 DIAGNOSIS — R590 Localized enlarged lymph nodes: Secondary | ICD-10-CM | POA: Insufficient documentation

## 2015-12-18 DIAGNOSIS — F329 Major depressive disorder, single episode, unspecified: Secondary | ICD-10-CM | POA: Insufficient documentation

## 2015-12-18 DIAGNOSIS — Z8639 Personal history of other endocrine, nutritional and metabolic disease: Secondary | ICD-10-CM | POA: Insufficient documentation

## 2015-12-18 DIAGNOSIS — Z8719 Personal history of other diseases of the digestive system: Secondary | ICD-10-CM | POA: Insufficient documentation

## 2015-12-18 DIAGNOSIS — R51 Headache: Secondary | ICD-10-CM

## 2015-12-18 DIAGNOSIS — Z79899 Other long term (current) drug therapy: Secondary | ICD-10-CM | POA: Insufficient documentation

## 2015-12-18 DIAGNOSIS — R519 Headache, unspecified: Secondary | ICD-10-CM

## 2015-12-18 DIAGNOSIS — Z8669 Personal history of other diseases of the nervous system and sense organs: Secondary | ICD-10-CM | POA: Insufficient documentation

## 2015-12-18 DIAGNOSIS — G43809 Other migraine, not intractable, without status migrainosus: Secondary | ICD-10-CM | POA: Insufficient documentation

## 2015-12-18 DIAGNOSIS — F17210 Nicotine dependence, cigarettes, uncomplicated: Secondary | ICD-10-CM | POA: Insufficient documentation

## 2015-12-18 DIAGNOSIS — Z791 Long term (current) use of non-steroidal anti-inflammatories (NSAID): Secondary | ICD-10-CM | POA: Insufficient documentation

## 2015-12-18 MED ORDER — DEXAMETHASONE SODIUM PHOSPHATE 10 MG/ML IJ SOLN
10.0000 mg | Freq: Once | INTRAMUSCULAR | Status: AC
  Administered 2015-12-18: 10 mg via INTRAVENOUS
  Filled 2015-12-18: qty 1

## 2015-12-18 MED ORDER — KETOROLAC TROMETHAMINE 30 MG/ML IJ SOLN
30.0000 mg | Freq: Once | INTRAMUSCULAR | Status: AC
  Administered 2015-12-18: 30 mg via INTRAVENOUS
  Filled 2015-12-18: qty 1

## 2015-12-18 MED ORDER — METOCLOPRAMIDE HCL 5 MG/ML IJ SOLN
10.0000 mg | Freq: Once | INTRAMUSCULAR | Status: AC
  Administered 2015-12-18: 10 mg via INTRAVENOUS
  Filled 2015-12-18: qty 2

## 2015-12-18 NOTE — ED Provider Notes (Signed)
CSN: [id number]     Arrival date & time 12/18/15  1631 History   First MD Initiated Contact with Patient 12/18/15 1923     Chief Complaint  Patient presents with  . Headache   (Consider location/radiation/quality/duration/timing/severity/associated sxs/prior Treatment) The history is provided by the patient. No language interpreter was used.     Michele Townsend is a 55 year old female with a past medical history of migraines, hyperlipidemia, sleep apnea and GERD who presents for a intermittent migraine headache for the past 2 weeks. She reports taking Tylenol, baclofen, and other over-the-counter medication that she cannot recall with minimal relief. She also reports that she has a knot on the right base of the skull that has enlarged over the past week. She states the headache is all over and is similar to her previous migraine headaches. She is followed by a neurology clinic.  She denies any fever, chills, sore throat, fatigue, neck pain, vision changes, focal temporal pain, confusion, syncope.  Past Medical History  Diagnosis Date  . Migraine   . HLD (hyperlipidemia)   . Depression   . IBS (irritable bowel syndrome)   . Current smoker   . Sleep apnea   . GERD (gastroesophageal reflux disease)    Past Surgical History  Procedure Laterality Date  . Tubal ligation    . Partial hysterectomy    . Abdominal hysterectomy    . Parathyroidectomy Right 04/14/2015  . Parathyroidectomy Right 04/14/2015    Procedure: RIGHT INFERIOR PARATHYROIDECTOMY;  Surgeon: Armandina Gemma, MD;  Location: [facility];  Service: General;  Laterality: Right;   No family history on file. Social History  Substance Use Topics  . Smoking status: Current Every Day Smoker -- 0.50 packs/day    Types: Cigarettes  . Smokeless tobacco: Never Used  . Alcohol Use: No   OB History    No data available     Review of Systems  Constitutional: Negative for fever.  Gastrointestinal: Negative for nausea and vomiting.   Musculoskeletal: Negative for neck pain.  Neurological: Positive for headaches. Negative for syncope, weakness and numbness.  All other systems reviewed and are negative.     Allergies  Review of patient's allergies indicates no known allergies.  Home Medications   Prior to Admission medications   Medication Sig Start Date End Date Taking? Authorizing Provider  acetaminophen (TYLENOL) 500 MG tablet Take 1,000 mg by mouth every 6 (six) hours as needed for moderate pain.   Yes Historical Provider, MD  ALPRAZolam Duanne Moron) 0.5 MG tablet Take 0.5 mg by mouth 2 (two) times daily as needed. For anxiety   Yes Historical Provider, MD  baclofen (LIORESAL) 10 MG tablet Take 10 mg by mouth 2 (two) times daily as needed. For headaches per patient   Yes Historical Provider, MD  estradiol (ESTRACE) 1 MG tablet Take 1 mg by mouth daily.   Yes Historical Provider, MD  meloxicam (MOBIC) 15 MG tablet Take 15 mg by mouth daily.   Yes Historical Provider, MD  PARoxetine (PAXIL) 20 MG tablet Take 20 mg by mouth daily.   Yes Historical Provider, MD  traMADol (ULTRAM) 50 MG tablet Take 50 mg by mouth every 6 (six) hours as needed for moderate pain.    Yes Historical Provider, MD  zonisamide (ZONEGRAN) 25 MG capsule Take 100 mg by mouth at bedtime.    Yes Historical Provider, MD  azithromycin (ZITHROMAX Z-PAK) 250 MG tablet 2 po day one, then 1 daily x 4 days Patient not taking: Reported on  04/02/2015 09/10/14   Veryl Speak, MD  oxyCODONE (OXY IR/ROXICODONE) 5 MG immediate release tablet Take 1-2 tablets (5-10 mg total) by mouth every 6 (six) hours as needed for moderate pain. Patient not taking: Reported on 12/18/2015 04/15/15   Armandina Gemma, MD   BP 130/72 mmHg  Pulse 89  Temp(Src) 98.4 F (36.9 C) (Oral)  Resp 18  SpO2 98% Physical Exam  Constitutional: She is oriented to person, place, and time. She appears well-developed and well-nourished.  HENT:  Head: Normocephalic and atraumatic.  Right posterior  cervical lymphadenopathy but no tenderness to palpation.  Eyes: Conjunctivae are normal.  PERRL  Neck: Normal range of motion and full passive range of motion without pain. Neck supple. No spinous process tenderness present. No Brudzinski's sign and no Kernig's sign noted.  Able to touch chin to chest without difficulty. No spinous process tenderness. No meningeal signs.  Cardiovascular: Normal rate, regular rhythm and normal heart sounds.   Pulmonary/Chest: Effort normal and breath sounds normal.  Abdominal: Soft. There is no tenderness.  Musculoskeletal: Normal range of motion.  Neurological: She is alert and oriented to person, place, and time. She has normal strength. No sensory deficit. She displays a negative Romberg sign. GCS eye subscore is 4. GCS verbal subscore is 5. GCS motor subscore is 6.  Negative Romberg. Normal sensory. Normal strength. GCS of 15. Cranial nerves III through XII intact.  Skin: Skin is warm and dry.  Psychiatric: She has a normal mood and affect.  Nursing note and vitals reviewed.   ED Course  Procedures (including critical care time) Labs Review Labs Reviewed - No data to display  Imaging Review No results found.    EKG Interpretation None      MDM   Final diagnoses:  Posterior cervical lymphadenopathy  Acute nonintractable headache, unspecified headache type  Other migraine without status migrainosus, not intractable  Patient with history of intermittent migraines presents for headache 2 weeks. She reports that this is similar to her previous headaches. She is followed by neurology and was put on baclofen but states this has not helped with this headache. She is well appearing and vital signs are stable. I do not suspect SAH, glaucoma, temporal arteritis, or meningitis.  I discussed with the patient that I would give her a headache cocktail which included Reglan, Toradol, and Decadron and reevaluate the patient. She also reports that she has a  swollen knot on the back of her head. The cyst consistent with a swollen lymph node but is not tender. I do not believe the patient needs antibiotics and can follow up outpatient for this.  I discussed this patient with Barbaraann Rondo, NP and the plan is to reassess after headache cocktail. If better, patient is to follow up with neurology.      Ottie Glazier, PA-C 12/19/15 [place], MD 12/19/15 [phone]

## 2015-12-18 NOTE — ED Provider Notes (Signed)
This is a 55 year old female states that she's had a headache since Christmas.  She was given headache cocktail that Jearld Lesch, PA prior to my arrival.  I checked on the patient, 43 minutes later.  She states the headache is much improved and she thinks she can manage at home.  She will be discharged  Junius Creamer, NP 12/18/15 2044  Ripley Fraise, MD 12/19/15 1300

## 2015-12-18 NOTE — ED Notes (Signed)
Pt c/o migraine since christmas. Pt noticed a knot to the bottom right side of her head that she says has gotten bigger since christmas. States she has taken tylenol, baclifen and another medicine she cannot recall, all with no relief.

## 2016-11-07 ENCOUNTER — Emergency Department (HOSPITAL_COMMUNITY): Payer: Worker's Compensation

## 2016-11-07 ENCOUNTER — Emergency Department (HOSPITAL_COMMUNITY)
Admission: EM | Admit: 2016-11-07 | Discharge: 2016-11-07 | Disposition: A | Discharge status: Home / Self Care | Payer: Worker's Compensation | Attending: Emergency Medicine | Admitting: Emergency Medicine

## 2016-11-07 ENCOUNTER — Encounter (HOSPITAL_COMMUNITY): Payer: Self-pay | Admitting: *Deleted

## 2016-11-07 DIAGNOSIS — S0083XA Contusion of other part of head, initial encounter: Secondary | ICD-10-CM | POA: Diagnosis not present

## 2016-11-07 DIAGNOSIS — F17210 Nicotine dependence, cigarettes, uncomplicated: Secondary | ICD-10-CM | POA: Insufficient documentation

## 2016-11-07 DIAGNOSIS — Z79899 Other long term (current) drug therapy: Secondary | ICD-10-CM | POA: Diagnosis not present

## 2016-11-07 DIAGNOSIS — Y9301 Activity, walking, marching and hiking: Secondary | ICD-10-CM | POA: Diagnosis not present

## 2016-11-07 DIAGNOSIS — W19XXXA Unspecified fall, initial encounter: Secondary | ICD-10-CM

## 2016-11-07 DIAGNOSIS — Y92512 Supermarket, store or market as the place of occurrence of the external cause: Secondary | ICD-10-CM | POA: Diagnosis not present

## 2016-11-07 DIAGNOSIS — S0993XA Unspecified injury of face, initial encounter: Secondary | ICD-10-CM | POA: Diagnosis present

## 2016-11-07 DIAGNOSIS — Y999 Unspecified external cause status: Secondary | ICD-10-CM | POA: Diagnosis not present

## 2016-11-07 DIAGNOSIS — W01198A Fall on same level from slipping, tripping and stumbling with subsequent striking against other object, initial encounter: Secondary | ICD-10-CM | POA: Diagnosis not present

## 2016-11-07 DIAGNOSIS — R51 Headache: Secondary | ICD-10-CM | POA: Insufficient documentation

## 2016-11-07 HISTORY — DX: Anxiety disorder, unspecified: F41.9

## 2016-11-07 MED ORDER — IBUPROFEN 400 MG PO TABS: 400.0000 mg | ORAL_TABLET | Freq: Four times a day (QID) | ORAL | 0 refills | Status: DC | PRN

## 2016-11-07 MED ORDER — IBUPROFEN 200 MG PO TABS
400.0000 mg | ORAL_TABLET | Freq: Once | ORAL | Status: AC
  Administered 2016-11-07: 400 mg via ORAL
  Filled 2016-11-07: qty 2

## 2016-11-07 NOTE — ED Triage Notes (Addendum)
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 C/o head, back pain and knee pain. No LOC. Towel brace to neck for stablization

## 2016-11-07 NOTE — ED Provider Notes (Signed)
[facility] DEPT Provider Note   CSN: [id number] Arrival date & time: 11/07/16  0940     History   Chief Complaint Chief Complaint  Patient presents with  . Fall  . Back Pain    HPI Michele Townsend is a 55 y.o. female.  HPI Patient presents to the emergency room after a fall at work. 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 Patient fell striking her head. She also landed on her right knee. She is now having pain in her head and neck right knee and her lower back. Her headache is severe. She is not sure if she lost consciousness. EMS was called and she was transported in. Patient denies any numbness or weakness. She denies any chest pain or abdominal pain. She denies taking any blood thinning agents. Past Medical History:  Diagnosis Date  . Anxiety   . Current smoker   . Depression   . GERD (gastroesophageal reflux disease)   . HLD (hyperlipidemia)   . IBS (irritable bowel syndrome)   . Migraine   . Sleep apnea     Patient Active Problem List   Diagnosis Date Noted  . Hyperparathyroidism, primary ([place]) 04/14/2015  . Primary hyperparathyroidism ([place]) 04/14/2015    Past Surgical History:  Procedure Laterality Date  . ABDOMINAL HYSTERECTOMY    . PARATHYROIDECTOMY Right 04/14/2015  . PARATHYROIDECTOMY Right 04/14/2015   Procedure: RIGHT INFERIOR PARATHYROIDECTOMY;  Surgeon: Armandina Gemma, MD;  Location: [facility];  Service: General;  Laterality: Right;  . PARTIAL HYSTERECTOMY    . TUBAL LIGATION      OB History    No data available       Home Medications    Prior to Admission medications   Medication Sig Start Date End Date Taking? Authorizing Provider  acetaminophen (TYLENOL) 500 MG tablet Take 1,000 mg by mouth every 6 (six) hours as needed for moderate pain.    Historical Provider, MD  ALPRAZolam Duanne Moron) 0.5 MG tablet Take 0.5 mg by mouth 2 (two) times daily as needed. For anxiety    Historical Provider, MD  azithromycin (ZITHROMAX Z-PAK) 250  MG tablet 2 po day one, then 1 daily x 4 days Patient not taking: Reported on 04/02/2015 09/10/14   Veryl Speak, MD  baclofen (LIORESAL) 10 MG tablet Take 10 mg by mouth 2 (two) times daily as needed. For headaches per patient    Historical Provider, MD  estradiol (ESTRACE) 1 MG tablet Take 1 mg by mouth daily.    Historical Provider, MD  ibuprofen (ADVIL,MOTRIN) 400 MG tablet Take 1 tablet (400 mg total) by mouth every 6 (six) hours as needed. 11/07/16   Dorie Rank, MD  meloxicam (MOBIC) 15 MG tablet Take 15 mg by mouth daily.    Historical Provider, MD  oxyCODONE (OXY IR/ROXICODONE) 5 MG immediate release tablet Take 1-2 tablets (5-10 mg total) by mouth every 6 (six) hours as needed for moderate pain. Patient not taking: Reported on 12/18/2015 04/15/15   Armandina Gemma, MD  PARoxetine (PAXIL) 20 MG tablet Take 20 mg by mouth daily.    Historical Provider, MD  traMADol (ULTRAM) 50 MG tablet Take 50 mg by mouth every 6 (six) hours as needed for moderate pain.     Historical Provider, MD  zonisamide (ZONEGRAN) 25 MG capsule Take 100 mg by mouth at bedtime.     Historical Provider, MD    Family History No family history on file.  Social History Social History  Substance  Use Topics  . Smoking status: Current Every Day Smoker    Packs/day: 0.50    Types: Cigarettes  . Smokeless tobacco: Never Used  . Alcohol use No     Allergies   Patient has no known allergies.   Review of Systems Review of Systems  All other systems reviewed and are negative.    Physical Exam Updated Vital Signs BP 134/78 (BP Location: Left Arm)   Pulse 67   Temp 98.6 F (37 C) (Oral)   Resp 18   Ht 5\' 4"  (1.626 m)   Wt 90.7 kg   SpO2 99%   BMI 34.33 kg/m   Physical Exam  Constitutional: She appears well-developed and well-nourished. No distress.  HENT:  Head: Normocephalic and atraumatic.  Right Ear: External ear normal.  Left Ear: External ear normal.  Eyes: Conjunctivae are normal. Right eye exhibits  no discharge. Left eye exhibits no discharge. No scleral icterus.  Neck: Neck supple. No tracheal deviation present.  Cardiovascular: Normal rate, regular rhythm and intact distal pulses.   Pulmonary/Chest: Effort normal and breath sounds normal. No stridor. No respiratory distress. She has no wheezes. She has no rales.  Abdominal: Soft. Bowel sounds are normal. She exhibits no distension. There is no tenderness. There is no rebound and no guarding.  Musculoskeletal: She exhibits no edema.       Right hip: Normal.       Left hip: Normal.       Right knee: Tenderness found.       Left knee: Normal.       Right ankle: Normal.       Left ankle: Normal.       Cervical back: She exhibits tenderness.       Thoracic back: Normal.       Lumbar back: She exhibits tenderness.  Neurological: She is alert. She has normal strength. No cranial nerve deficit (no facial droop, extraocular movements intact, no slurred speech) or sensory deficit. She exhibits normal muscle tone. She displays no seizure activity. Coordination normal.  Skin: Skin is warm and dry. No rash noted.  Psychiatric: She has a normal mood and affect.  Nursing note and vitals reviewed.    ED Treatments / Results  Labs (all labs ordered are listed, but only abnormal results are displayed) Labs Reviewed - No data to display  EKG  EKG Interpretation None       Radiology Dg Lumbar Spine Complete  Result Date: 11/07/2016 CLINICAL DATA:  Slip and fall with low back pain, initial encounter EXAM: LUMBAR SPINE - COMPLETE 4+ VIEW COMPARISON:  None. FINDINGS: Vertebral body height is well maintained. No pars defects are noted. No paraspinal mass lesion is seen. No soft tissue abnormality is noted. IMPRESSION: No acute abnormality noted. Electronically Signed   By: Inez Catalina M.D.   On: 11/07/2016 10:58   Ct Head Wo Contrast  Result Date: 11/07/2016 CLINICAL DATA:  Slip and fall with headaches and neck pain, initial encounter  EXAM: CT HEAD WITHOUT CONTRAST CT CERVICAL SPINE WITHOUT CONTRAST TECHNIQUE: Multidetector CT imaging of the head and cervical spine was performed following the standard protocol without intravenous contrast. Multiplanar CT image reconstructions of the cervical spine were also generated. COMPARISON:  None. FINDINGS: CT HEAD FINDINGS Brain: No evidence of acute infarction, hemorrhage, hydrocephalus, extra-axial collection or mass lesion/mass effect. Vascular: No hyperdense vessel or unexpected calcification. Skull: Normal. Negative for fracture or focal lesion. Sinuses/Orbits: No acute finding. Other: None. CT CERVICAL SPINE FINDINGS  Alignment: Normal. Skull base and vertebrae: 7 cervical segments are noted. Changes of prior fusion are noted at C5-6. No acute fracture or acute facet abnormality is noted. Soft tissues and spinal canal: No prevertebral fluid or swelling. No visible canal hematoma. Disc levels:  Within normal limits. Upper chest: Within normal limits. IMPRESSION: CT of the head:  No acute intracranial abnormality noted. CT of the cervical spine: Prior fusion at C5-6. No acute abnormality is noted. Electronically Signed   By: Inez Catalina M.D.   On: 11/07/2016 10:55   Ct Cervical Spine Wo Contrast  Result Date: 11/07/2016 CLINICAL DATA:  Slip and fall with headaches and neck pain, initial encounter EXAM: CT HEAD WITHOUT CONTRAST CT CERVICAL SPINE WITHOUT CONTRAST TECHNIQUE: Multidetector CT imaging of the head and cervical spine was performed following the standard protocol without intravenous contrast. Multiplanar CT image reconstructions of the cervical spine were also generated. COMPARISON:  None. FINDINGS: CT HEAD FINDINGS Brain: No evidence of acute infarction, hemorrhage, hydrocephalus, extra-axial collection or mass lesion/mass effect. Vascular: No hyperdense vessel or unexpected calcification. Skull: Normal. Negative for fracture or focal lesion. Sinuses/Orbits: No acute finding. Other:  None. CT CERVICAL SPINE FINDINGS Alignment: Normal. Skull base and vertebrae: 7 cervical segments are noted. Changes of prior fusion are noted at C5-6. No acute fracture or acute facet abnormality is noted. Soft tissues and spinal canal: No prevertebral fluid or swelling. No visible canal hematoma. Disc levels:  Within normal limits. Upper chest: Within normal limits. IMPRESSION: CT of the head:  No acute intracranial abnormality noted. CT of the cervical spine: Prior fusion at C5-6. No acute abnormality is noted. Electronically Signed   By: Inez Catalina M.D.   On: 11/07/2016 10:55   Dg Knee Complete 4 Views Right  Result Date: 11/07/2016 CLINICAL DATA:  Injury post fall, back pain, knee pain EXAM: RIGHT KNEE - COMPLETE 4+ VIEW COMPARISON:  02/11/2015 FINDINGS: Four views of the right knee submitted. No acute fracture or subluxation. Mild narrowing of medial joint compartment. No joint effusion. Again noted some focal sclerosis in fibular head suspicious for osteochondroma or bone infarct. Further correlation with MRI could be performed as clinically warranted. IMPRESSION: No acute fracture or subluxation. Mild narrowing of medial joint compartment. No joint effusion. Again noted some focal sclerosis in fibular head suspicious for osteochondroma or bone infarct. Further correlation with MRI could be performed as clinically warranted. Electronically Signed   By: Lahoma Crocker M.D.   On: 11/07/2016 10:57    Procedures Procedures (including critical care time)  Medications Ordered in ED Medications  ibuprofen (ADVIL,MOTRIN) tablet 400 mg (400 mg Oral Given 11/07/16 1007)     Initial Impression / Assessment and Plan / ED Course  I have reviewed the triage vital signs and the nursing notes.  Pertinent labs & imaging results that were available during my care of the patient were reviewed by me and considered in my medical decision making (see chart for details).  Clinical Course as of Nov 07 1150  Nancy Fetter  Nov 07, 2016  1150 I discussed the x-ray findings with the patient. No signs of any serious injuries. Incidental right knee finding was discussed. I suggested outpatient follow-up with an orthopedic doctor.  [id number] We also discussed follow-up with occupational health considering this was an on-the-job injury  [JK]    Clinical Course User Index [JK] Dorie Rank, MD     Final Clinical Impressions(s) / ED Diagnoses   Final diagnoses:  Contusion of face,  initial encounter  Fall, initial encounter    New Prescriptions New Prescriptions   IBUPROFEN (ADVIL,MOTRIN) 400 MG TABLET    Take 1 tablet (400 mg total) by mouth every 6 (six) hours as needed.     Dorie Rank, MD 11/07/16 [phone]

## 2016-11-07 NOTE — Discharge Instructions (Signed)
The x-rays did not show any broken bones or dislocations,  you certainly may feel stiff and sore over the next week as you heal from your  injuries,   the x-ray did show an incidental finding in the right knee as we discussed, consider seeing an orthopedic doctor.

## 2016-11-07 NOTE — ED Notes (Signed)
Bed: [id number] Expected date:  Expected time:  Means of arrival:  Comments: 55 yo fall, LSB

## 2016-11-23 ENCOUNTER — Other Ambulatory Visit: Payer: Self-pay | Admitting: Nurse Practitioner

## 2016-11-23 DIAGNOSIS — S0990XA Unspecified injury of head, initial encounter: Secondary | ICD-10-CM

## 2016-11-23 DIAGNOSIS — S199XXA Unspecified injury of neck, initial encounter: Secondary | ICD-10-CM

## 2016-12-01 ENCOUNTER — Other Ambulatory Visit: Payer: Self-pay

## 2016-12-01 ENCOUNTER — Ambulatory Visit
Admission: RE | Admit: 2016-12-01 | Discharge: 2016-12-01 | Disposition: A | Discharge status: Home / Self Care | Payer: Worker's Compensation | Source: Ambulatory Visit | Attending: Nurse Practitioner | Admitting: Nurse Practitioner

## 2016-12-01 DIAGNOSIS — S0990XA Unspecified injury of head, initial encounter: Secondary | ICD-10-CM

## 2016-12-01 DIAGNOSIS — S199XXA Unspecified injury of neck, initial encounter: Secondary | ICD-10-CM

## 2016-12-01 MED ORDER — GADOBENATE DIMEGLUMINE 529 MG/ML IV SOLN
20.0000 mL | Freq: Once | INTRAVENOUS | Status: AC | PRN
  Administered 2016-12-01: 20 mL via INTRAVENOUS

## 2016-12-08 ENCOUNTER — Other Ambulatory Visit: Payer: Self-pay | Admitting: Nurse Practitioner

## 2016-12-08 DIAGNOSIS — R930 Abnormal findings on diagnostic imaging of skull and head, not elsewhere classified: Secondary | ICD-10-CM

## 2016-12-10 ENCOUNTER — Ambulatory Visit
Admission: RE | Admit: 2016-12-10 | Discharge: 2016-12-10 | Disposition: A | Discharge status: Home / Self Care | Payer: Worker's Compensation | Source: Ambulatory Visit | Attending: Nurse Practitioner | Admitting: Nurse Practitioner

## 2016-12-10 DIAGNOSIS — R930 Abnormal findings on diagnostic imaging of skull and head, not elsewhere classified: Secondary | ICD-10-CM

## 2016-12-10 MED ORDER — IOPAMIDOL (ISOVUE-300) INJECTION 61%
80.0000 mL | Freq: Once | INTRAVENOUS | Status: AC | PRN
  Administered 2016-12-10: 80 mL via INTRAVENOUS

## 2017-01-28 ENCOUNTER — Encounter: Payer: Self-pay | Admitting: Physical Medicine & Rehabilitation

## 2017-02-18 ENCOUNTER — Encounter
Payer: Worker's Compensation | Attending: Physical Medicine & Rehabilitation | Admitting: Physical Medicine & Rehabilitation

## 2017-02-18 ENCOUNTER — Encounter: Payer: Self-pay | Admitting: Physical Medicine & Rehabilitation

## 2017-02-18 DIAGNOSIS — Z79899 Other long term (current) drug therapy: Secondary | ICD-10-CM | POA: Diagnosis not present

## 2017-02-18 DIAGNOSIS — G894 Chronic pain syndrome: Secondary | ICD-10-CM | POA: Diagnosis not present

## 2017-02-18 DIAGNOSIS — F0781 Postconcussional syndrome: Secondary | ICD-10-CM

## 2017-02-18 DIAGNOSIS — F329 Major depressive disorder, single episode, unspecified: Secondary | ICD-10-CM | POA: Insufficient documentation

## 2017-02-18 DIAGNOSIS — R42 Dizziness and giddiness: Secondary | ICD-10-CM | POA: Diagnosis not present

## 2017-02-18 DIAGNOSIS — F17210 Nicotine dependence, cigarettes, uncomplicated: Secondary | ICD-10-CM | POA: Insufficient documentation

## 2017-02-18 DIAGNOSIS — K219 Gastro-esophageal reflux disease without esophagitis: Secondary | ICD-10-CM | POA: Insufficient documentation

## 2017-02-18 DIAGNOSIS — F419 Anxiety disorder, unspecified: Secondary | ICD-10-CM | POA: Diagnosis present

## 2017-02-18 DIAGNOSIS — E785 Hyperlipidemia, unspecified: Secondary | ICD-10-CM | POA: Diagnosis not present

## 2017-02-18 DIAGNOSIS — R110 Nausea: Secondary | ICD-10-CM | POA: Diagnosis not present

## 2017-02-18 DIAGNOSIS — R200 Anesthesia of skin: Secondary | ICD-10-CM | POA: Insufficient documentation

## 2017-02-18 DIAGNOSIS — G479 Sleep disorder, unspecified: Secondary | ICD-10-CM | POA: Diagnosis not present

## 2017-02-18 DIAGNOSIS — K589 Irritable bowel syndrome without diarrhea: Secondary | ICD-10-CM | POA: Insufficient documentation

## 2017-02-18 DIAGNOSIS — R531 Weakness: Secondary | ICD-10-CM | POA: Diagnosis not present

## 2017-02-18 DIAGNOSIS — I6521 Occlusion and stenosis of right carotid artery: Secondary | ICD-10-CM | POA: Diagnosis not present

## 2017-02-18 DIAGNOSIS — Z5181 Encounter for therapeutic drug level monitoring: Secondary | ICD-10-CM

## 2017-02-18 MED ORDER — AMITRIPTYLINE HCL 10 MG PO TABS: 10.0000 mg | ORAL_TABLET | Freq: Every day | ORAL | 1 refills | Status: DC

## 2017-02-18 MED ORDER — PROPRANOLOL HCL 10 MG PO TABS: 10.0000 mg | ORAL_TABLET | Freq: Three times a day (TID) | ORAL | 1 refills | Status: DC

## 2017-02-18 MED ORDER — ONDANSETRON HCL 8 MG PO TABS: 8.0000 mg | ORAL_TABLET | Freq: Three times a day (TID) | ORAL | 0 refills | Status: DC | PRN

## 2017-02-18 NOTE — Progress Notes (Addendum)
Subjective:    Patient ID: Michele Townsend, female    DOB: 1961-01-12, 56 y.o.   MRN: [id number]  HPI  56 y/o anxiety, depression presents with post-concussive syndrome.  Located in right temple after a fall at work.  Started 11/07/16.  Denies alleviating factors.  Oxycodone briefly improves the pain.  Bending over exacerbates the pain .She has associated nausea. Constant. Some photophobia.  Denies phonophobia. Dull, sharp, pounding.  Non-radiating.  Ibu, tylenol do not seem to help.  Zofran did not help.  Pain inhibits anything she likes.  Denies falls. Of notes pt's husband passed away in 01/31/23.   Pain Inventory Average Pain 6 Pain Right Now 6 My pain is intermittent, constant, sharp, burning, dull, stabbing, tingling and aching  In the last 24 hours, has pain interfered with the following? General activity 6 Relation with others 5 Enjoyment of life 5 What TIME of day is your pain at its worst? all times Sleep (in general) Fair  Pain is worse with: bending, sitting, standing and some activites Pain improves with: rest and pacing activities Relief from Meds: 3  Mobility walk without assistance how many minutes can you walk? 70min ability to climb steps?  yes do you drive?  yes Do you have any goals in this area?  yes  Function employed # of hrs/week 16-22  Neuro/Psych weakness numbness tingling trouble walking spasms dizziness depression anxiety  Prior Studies .  Physicians involved in your care .   No family history on file. Social History   Social History  . Marital status: Married    Spouse name: N/A  . Number of children: N/A  . Years of education: N/A   Social History Main Topics  . Smoking status: Current Every Day Smoker    Packs/day: 0.50    Types: Cigarettes  . Smokeless tobacco: Never Used  . Alcohol use No  . Drug use: No  . Sexual activity: Not Asked   Other Topics Concern  . None   Social History Narrative  . None   Past  Surgical History:  Procedure Laterality Date  . ABDOMINAL HYSTERECTOMY    . PARATHYROIDECTOMY Right 04/14/2015  . PARATHYROIDECTOMY Right 04/14/2015   Procedure: RIGHT INFERIOR PARATHYROIDECTOMY;  Surgeon: Armandina Gemma, MD;  Location: [facility];  Service: General;  Laterality: Right;  . PARTIAL HYSTERECTOMY    . TUBAL LIGATION     Past Medical History:  Diagnosis Date  . Anxiety   . Current smoker   . Depression   . GERD (gastroesophageal reflux disease)   . HLD (hyperlipidemia)   . IBS (irritable bowel syndrome)   . Migraine   . Sleep apnea    BP 139/90   Pulse 89   SpO2 98%   Opioid Risk Score:   Fall Risk Score:  `1  Depression screen PHQ 2/9  Depression screen PHQ 2/9 02/18/2017  Decreased Interest 3  Down, Depressed, Hopeless 3  PHQ - 2 Score 6  Altered sleeping 3  Tired, decreased energy 3  Change in appetite 1  Feeling bad or failure about yourself  3  Trouble concentrating 1  Moving slowly or fidgety/restless 1  Suicidal thoughts 1  PHQ-9 Score 19     Review of Systems  Constitutional: Positive for diaphoresis.  HENT: Negative.   Eyes: Negative.   Respiratory: Positive for apnea, cough and wheezing.   Cardiovascular: Negative.   Gastrointestinal: Positive for constipation, diarrhea, nausea and vomiting.  Endocrine: Negative.   Genitourinary: Negative.  Musculoskeletal: Positive for neck pain.  Skin: Negative.   Allergic/Immunologic: Negative.   Neurological: Positive for dizziness, weakness and numbness.  Hematological: Negative.   Psychiatric/Behavioral: Positive for dysphoric mood. The patient is nervous/anxious.       Objective:   Physical Exam Gen: NAD. Vital signs reviewed HENT: Normocephalic, Atraumatic Eyes: EOMI. No discharge.  Cardio: RRR. No JVD. Pulm: B/l clear to auscultation.  Effort normal Abd: Soft, BS+ MSK:  Gait WNL.   TTP diffusely.    No edema.   +TTP right periorbital and temporal region Neuro: CN II-XII grossly intact.     Sensation intact to light touch in face  Reflexes 2+ throughout  Strength  5/5 in all UE myotomes Skin: Warm and Dry. Intact     Assessment & Plan:  56 y/o anxiety, depression presents with post-concussive syndrome.   1. Post-concussive syndrome  MRI head reviewed, no acute process.  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   Labs reviewed  Referral information reviewed  [place] reviewed  UDS performed  Cont tylenol PRN  Will order Propranolol 10mg  TID  Will order Elavil 10mg  qhs  Will refer to Psychology due to chronic symptoms exacerbated by psychosocial factors   2. Sleep disturbance  Elavil ordered  3. Right ICA occlusion  Will refer to Vascular surgery  4. Nausea  Zofran 8mg  TID PRN ordered  Discussed with case Freight forwarder.  Pt not to return to work until symptoms resolve as this could prolong recovery and interfere with productivity.

## 2017-02-24 LAB — TOXASSURE SELECT,+ANTIDEPR,UR

## 2017-03-08 ENCOUNTER — Telehealth: Payer: Self-pay

## 2017-03-08 NOTE — Telephone Encounter (Signed)
Patient called and left message, states medicine is not working ie amitriptyline and inderal, still having headaches and trouble sleeping, please advise

## 2017-03-09 NOTE — Telephone Encounter (Signed)
Patient would like a call back from our clinic about message she left yesterday.

## 2017-03-10 NOTE — Telephone Encounter (Signed)
RN CM called about Michele Townsend medication not working.  I have notified her that Dr Posey Pronto will be out of office until next Wednesday and we can do nothing until he returns.  We will give Michele Townsend a call.

## 2017-03-10 NOTE — Telephone Encounter (Signed)
Called and relayed information to mrs Cathy as well

## 2017-03-18 ENCOUNTER — Encounter: Payer: Self-pay | Admitting: Physical Medicine & Rehabilitation

## 2017-03-18 ENCOUNTER — Encounter
Payer: Worker's Compensation | Attending: Physical Medicine & Rehabilitation | Admitting: Physical Medicine & Rehabilitation

## 2017-03-18 VITALS — BP 126/78 | HR 86 | Resp 14

## 2017-03-18 DIAGNOSIS — F419 Anxiety disorder, unspecified: Secondary | ICD-10-CM | POA: Insufficient documentation

## 2017-03-18 DIAGNOSIS — Z90711 Acquired absence of uterus with remaining cervical stump: Secondary | ICD-10-CM | POA: Diagnosis not present

## 2017-03-18 DIAGNOSIS — G43909 Migraine, unspecified, not intractable, without status migrainosus: Secondary | ICD-10-CM | POA: Insufficient documentation

## 2017-03-18 DIAGNOSIS — K219 Gastro-esophageal reflux disease without esophagitis: Secondary | ICD-10-CM | POA: Diagnosis not present

## 2017-03-18 DIAGNOSIS — K589 Irritable bowel syndrome without diarrhea: Secondary | ICD-10-CM | POA: Insufficient documentation

## 2017-03-18 DIAGNOSIS — G4730 Sleep apnea, unspecified: Secondary | ICD-10-CM | POA: Diagnosis not present

## 2017-03-18 DIAGNOSIS — G479 Sleep disorder, unspecified: Secondary | ICD-10-CM

## 2017-03-18 DIAGNOSIS — F0781 Postconcussional syndrome: Secondary | ICD-10-CM | POA: Diagnosis not present

## 2017-03-18 DIAGNOSIS — F329 Major depressive disorder, single episode, unspecified: Secondary | ICD-10-CM | POA: Insufficient documentation

## 2017-03-18 DIAGNOSIS — E785 Hyperlipidemia, unspecified: Secondary | ICD-10-CM | POA: Insufficient documentation

## 2017-03-18 DIAGNOSIS — G894 Chronic pain syndrome: Secondary | ICD-10-CM | POA: Diagnosis not present

## 2017-03-18 DIAGNOSIS — I6521 Occlusion and stenosis of right carotid artery: Secondary | ICD-10-CM | POA: Diagnosis not present

## 2017-03-18 DIAGNOSIS — F17210 Nicotine dependence, cigarettes, uncomplicated: Secondary | ICD-10-CM | POA: Insufficient documentation

## 2017-03-18 DIAGNOSIS — R110 Nausea: Secondary | ICD-10-CM | POA: Insufficient documentation

## 2017-03-18 DIAGNOSIS — E890 Postprocedural hypothyroidism: Secondary | ICD-10-CM | POA: Insufficient documentation

## 2017-03-18 MED ORDER — AMITRIPTYLINE HCL 50 MG PO TABS: 50.0000 mg | ORAL_TABLET | Freq: Every day | ORAL | 1 refills | Status: DC

## 2017-03-18 MED ORDER — PROPRANOLOL HCL 20 MG PO TABS: 30.0000 mg | ORAL_TABLET | Freq: Three times a day (TID) | ORAL | 1 refills | Status: DC

## 2017-03-18 NOTE — Progress Notes (Signed)
Subjective:    Patient ID: Michele Townsend, female    DOB: 1961-10-15, 56 y.o.   MRN: [id number]  HPI  56 y/o anxiety, depression presents for follow up of post-concussive syndrome.  Initially stated: Located in right temple after a fall at work.  Started 11/07/16.  Denies alleviating factors.  Oxycodone briefly improves the pain.  Bending over exacerbates the pain .She has associated nausea. Constant. Some photophobia.  Denies phonophobia. Dull, sharp, pounding.  Non-radiating.  Ibu, tylenol do not seem to help.  Zofran did not help.  Pain inhibits anything she likes.  Denies falls. Of notes pt's husband passed away in 01/30/23.   Last clinic visit 02/18/17.  At that time, she was prescribed propranolol and elavil, which did not provide benefit.  She is scheduled to see Psychology in a couple of weeks.  Sleep is still poor. She was referred to Vasc surgery and sees them next week.  Zofran has been helping.    Pain Inventory Average Pain 8 Pain Right Now 8 My pain is intermittent, constant, sharp, burning, dull, stabbing, tingling and aching  In the last 24 hours, has pain interfered with the following? General activity 7 Relation with others 6 Enjoyment of life 7 What TIME of day is your pain at its worst? all  Sleep (in general) Poor  Pain is worse with: walking, bending, sitting, inactivity, standing and some activites Pain improves with: rest and pacing activities Relief from Meds: 0  Mobility walk without assistance how many minutes can you walk? 15-30 ability to climb steps?  yes do you drive?  yes transfers alone Do you have any goals in this area?  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  yes  Neuro/Psych weakness numbness tingling dizziness depression anxiety  Prior Studies Any changes since last visit?  no  Physicians involved in your care Any changes since last visit?  no   History reviewed. No  pertinent family history.of concussion.  Social History   Social History  . Marital status: Married    Spouse name: N/A  . Number of children: N/A  . Years of education: N/A   Social History Main Topics  . Smoking status: Current Every Day Smoker    Packs/day: 0.50    Types: Cigarettes  . Smokeless tobacco: Never Used  . Alcohol use No  . Drug use: No  . Sexual activity: Not Asked   Other Topics Concern  . None   Social History Narrative  . None   Past Surgical History:  Procedure Laterality Date  . ABDOMINAL HYSTERECTOMY    . PARATHYROIDECTOMY Right 04/14/2015  . PARATHYROIDECTOMY Right 04/14/2015   Procedure: RIGHT INFERIOR PARATHYROIDECTOMY;  Surgeon: Armandina Gemma, MD;  Location: [facility];  Service: General;  Laterality: Right;  . PARTIAL HYSTERECTOMY    . TUBAL LIGATION     Past Medical History:  Diagnosis Date  . Anxiety   . Current smoker   . Depression   . GERD (gastroesophageal reflux disease)   . HLD (hyperlipidemia)   . IBS (irritable bowel syndrome)   . Migraine   . Sleep apnea    BP 126/78 (BP Location: Right Arm, Patient Position: Sitting, Cuff Size: Large)   Pulse 86   Resp 14   SpO2 98%   Opioid Risk Score:   Fall Risk Score:  `1  Depression screen PHQ 2/9  Depression screen [facility] 2/9 02/18/2017  Decreased Interest 3  Down, Depressed, Hopeless 3  PHQ - 2 Score 6  Altered sleeping 3  Tired, decreased energy 3  Change in appetite 1  Feeling bad or failure about yourself  3  Trouble concentrating 1  Moving slowly or fidgety/restless 1  Suicidal thoughts 1  PHQ-9 Score 19     Review of Systems  Constitutional: Positive for unexpected weight change.  HENT: Negative.   Eyes: Negative.   Respiratory: Positive for apnea, cough and wheezing.   Cardiovascular: Negative.   Gastrointestinal: Positive for constipation, diarrhea and nausea.  Endocrine: Negative.   Genitourinary: Negative.   Musculoskeletal: Positive for neck pain and neck  stiffness.  Skin: Negative.   Allergic/Immunologic: Negative.   Neurological: Positive for dizziness, weakness, numbness and headaches.       Tingling  Hematological: Negative.   Psychiatric/Behavioral: Positive for dysphoric mood. The patient is nervous/anxious.   All other systems reviewed and are negative.     Objective:   Physical Exam Gen: NAD. Vital signs reviewed HENT: Normocephalic, Atraumatic Eyes: EOMI. No discharge.  Cardio: RRR. No JVD. Pulm: B/l clear to auscultation.  Effort normal Abd: Soft, BS+ MSK:  Gait WNL.   No edema.   +TTP right >L periorbital and temporal region Neuro: CN II-XII grossly intact.    Sensation intact to light touch in face  Strength  5/5 in all UE myotomes Skin: Warm and Dry. Intact Psych: Slightly anxious.     Assessment & Plan:  56 y/o anxiety, depression presents for follow up of post-concussive syndrome.   1. Post-concussive syndrome  MRI head reviewed, no acute process.  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   UDS reviewed  Cont tylenol PRN  Will increase Propranolol to 30mg  TID  Will increase Elavil to 50mg  qhs  Pt to see Psychology due to chronic symptoms exacerbated by psychosocial factors in a couple of weeks   2. Sleep disturbance  Elavil ordered  3. Right ICA occlusion  Pt to see Vascular surgery in a couple of weeks  4. Nausea  Cont Zofran 8mg  TID PRN  Discussed with case Freight forwarder.  Pt not to return to work until symptoms resolve as this could prolong recovery and interfere with productivity.

## 2017-03-23 ENCOUNTER — Telehealth: Payer: Self-pay

## 2017-03-23 NOTE — Telephone Encounter (Signed)
Called stating was advised by NCM to inform us that her elavil is keeping her awake at nights and is unable to sleep

## 2017-03-23 NOTE — Telephone Encounter (Signed)
If she is able to tolerate she should it for a week at the current dose.  If not, she should decrease the dose to 25mg  (1/2 tab)

## 2017-03-24 ENCOUNTER — Ambulatory Visit: Payer: Self-pay | Admitting: Physical Medicine & Rehabilitation

## 2017-03-24 NOTE — Telephone Encounter (Signed)
I have given her the information.

## 2017-03-31 ENCOUNTER — Telehealth: Payer: Self-pay | Admitting: Registered Nurse

## 2017-03-31 NOTE — Telephone Encounter (Signed)
On 03/31/2017 the [facility] was reviewed no conflict was seen on the [facility] with multiple prescribers.  If there were any discrepancies this would have been reported to her physician.

## 2017-03-31 NOTE — Telephone Encounter (Signed)
Michele Townsend had a UDS performed on 02/18/2017, her Alprazolam being prescibed by [facility], last prescription was on 11/25/16. Oxycodone was picked up on 12/17, by Dr. Oletta Lamas. Tramadol picked up last on 07/23/16. Consistent.

## 2017-04-12 ENCOUNTER — Telehealth: Payer: Self-pay | Admitting: *Deleted

## 2017-04-12 ENCOUNTER — Encounter: Payer: Worker's Compensation | Attending: Psychology | Admitting: Psychology

## 2017-04-12 DIAGNOSIS — G894 Chronic pain syndrome: Secondary | ICD-10-CM

## 2017-04-12 DIAGNOSIS — E785 Hyperlipidemia, unspecified: Secondary | ICD-10-CM | POA: Diagnosis not present

## 2017-04-12 DIAGNOSIS — F329 Major depressive disorder, single episode, unspecified: Secondary | ICD-10-CM | POA: Diagnosis present

## 2017-04-12 DIAGNOSIS — G8929 Other chronic pain: Secondary | ICD-10-CM | POA: Diagnosis not present

## 2017-04-12 DIAGNOSIS — R410 Disorientation, unspecified: Secondary | ICD-10-CM | POA: Diagnosis not present

## 2017-04-12 DIAGNOSIS — F419 Anxiety disorder, unspecified: Secondary | ICD-10-CM | POA: Insufficient documentation

## 2017-04-12 DIAGNOSIS — K219 Gastro-esophageal reflux disease without esophagitis: Secondary | ICD-10-CM | POA: Insufficient documentation

## 2017-04-12 DIAGNOSIS — G479 Sleep disorder, unspecified: Secondary | ICD-10-CM

## 2017-04-12 DIAGNOSIS — F0781 Postconcussional syndrome: Secondary | ICD-10-CM | POA: Diagnosis not present

## 2017-04-12 DIAGNOSIS — K589 Irritable bowel syndrome without diarrhea: Secondary | ICD-10-CM | POA: Diagnosis not present

## 2017-04-12 DIAGNOSIS — R200 Anesthesia of skin: Secondary | ICD-10-CM | POA: Insufficient documentation

## 2017-04-12 DIAGNOSIS — G4730 Sleep apnea, unspecified: Secondary | ICD-10-CM | POA: Insufficient documentation

## 2017-04-12 DIAGNOSIS — G43709 Chronic migraine without aura, not intractable, without status migrainosus: Secondary | ICD-10-CM | POA: Diagnosis not present

## 2017-04-12 DIAGNOSIS — F17210 Nicotine dependence, cigarettes, uncomplicated: Secondary | ICD-10-CM | POA: Insufficient documentation

## 2017-04-12 DIAGNOSIS — R531 Weakness: Secondary | ICD-10-CM | POA: Diagnosis not present

## 2017-04-12 DIAGNOSIS — R42 Dizziness and giddiness: Secondary | ICD-10-CM | POA: Diagnosis not present

## 2017-04-12 NOTE — Telephone Encounter (Signed)
Michele Townsend called and has a question for Dr Sima Matas about what she needs to give to attorney. Please call.

## 2017-04-14 ENCOUNTER — Encounter
Payer: Worker's Compensation | Attending: Physical Medicine & Rehabilitation | Admitting: Physical Medicine & Rehabilitation

## 2017-04-14 ENCOUNTER — Encounter: Payer: Self-pay | Admitting: Physical Medicine & Rehabilitation

## 2017-04-14 VITALS — BP 110/80 | HR 112

## 2017-04-14 DIAGNOSIS — F0781 Postconcussional syndrome: Secondary | ICD-10-CM | POA: Diagnosis not present

## 2017-04-14 DIAGNOSIS — S060X9A Concussion with loss of consciousness of unspecified duration, initial encounter: Secondary | ICD-10-CM | POA: Insufficient documentation

## 2017-04-14 DIAGNOSIS — Z720 Tobacco use: Secondary | ICD-10-CM | POA: Diagnosis not present

## 2017-04-14 DIAGNOSIS — G479 Sleep disorder, unspecified: Secondary | ICD-10-CM | POA: Diagnosis not present

## 2017-04-14 DIAGNOSIS — G894 Chronic pain syndrome: Secondary | ICD-10-CM

## 2017-04-14 DIAGNOSIS — F1520 Other stimulant dependence, uncomplicated: Secondary | ICD-10-CM

## 2017-04-14 DIAGNOSIS — X58XXXA Exposure to other specified factors, initial encounter: Secondary | ICD-10-CM | POA: Insufficient documentation

## 2017-04-14 MED ORDER — LIDOCAINE 5 % EX PTCH: 1.0000 | MEDICATED_PATCH | CUTANEOUS | 1 refills | Status: DC

## 2017-04-14 MED ORDER — AMITRIPTYLINE HCL 50 MG PO TABS
25.0000 mg | ORAL_TABLET | Freq: Every day | ORAL | 1 refills | Status: DC
Start: 2017-04-14 — End: 2017-05-06

## 2017-04-14 MED ORDER — MELATONIN 1 MG PO TABS: 0.5000 mg | ORAL_TABLET | Freq: Every day | ORAL | 1 refills | Status: DC

## 2017-04-14 MED ORDER — TOPIRAMATE 25 MG PO TABS: 25.0000 mg | ORAL_TABLET | Freq: Every day | ORAL | 1 refills | Status: DC

## 2017-04-14 MED ORDER — ONDANSETRON HCL 8 MG PO TABS: 8.0000 mg | ORAL_TABLET | Freq: Three times a day (TID) | ORAL | 0 refills | Status: DC | PRN

## 2017-04-14 NOTE — Addendum Note (Signed)
Addended by: Delice Lesch A on: 04/14/2017 10:29 AM   Modules accepted: Orders

## 2017-04-14 NOTE — Progress Notes (Addendum)
Subjective:    Patient ID: Michele Townsend, female    DOB: January 21, 1961, 56 y.o.   MRN: [id number]  HPI  56 y/o anxiety, depression presents for follow up of post-concussive syndrome.   Initially stated: Located in right temple after a fall at work.  Started 11/07/16.  Denies alleviating factors.  Oxycodone briefly improves the pain.  Bending over exacerbates the pain .She has associated nausea. Constant. Some photophobia.  Denies phonophobia. Dull, sharp, pounding.  Non-radiating.  Ibu, tylenol do not seem to help.  Zofran did not help.  Pain inhibits anything she likes.  Denies falls. Of notes pt's husband passed away in 01/30/2023.   Last clinic visit 03/18/17.  Since last visit, pt states she is still in pain.  The Elavil 50 causes her to stay awake at night.  She does not notice difference with Propranolol.  She saw Psychology, notes reviewed. She saw Vascular surgery, reviewed, who did not believe any vascular contribution to symptoms.   Pain Inventory Average Pain 8 Pain Right Now 8 My pain is intermittent, constant, sharp, burning, dull, stabbing, tingling and aching  In the last 24 hours, has pain interfered with the following? General activity 7 Relation with others 6 Enjoyment of life 7 What TIME of day is your pain at its worst? all  Sleep (in general) Poor  Pain is worse with: walking, bending, sitting, inactivity, standing and some activites Pain improves with: rest and pacing activities Relief from Meds: 0  Mobility walk without assistance how many minutes can you walk? 15-30 ability to climb steps?  yes do you drive?  yes transfers alone Do you have any goals in this area?  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  yes  Neuro/Psych weakness numbness tingling dizziness depression anxiety  Prior Studies Any changes since last visit?  no  Physicians involved in your care Any changes since last  visit?  no   No family history on file.of concussion.  Social History   Social History  . Marital status: Married    Spouse name: N/A  . Number of children: N/A  . Years of education: N/A   Social History Main Topics  . Smoking status: Current Every Day Smoker    Packs/day: 0.50    Types: Cigarettes  . Smokeless tobacco: Never Used  . Alcohol use No  . Drug use: No  . Sexual activity: Not Asked   Other Topics Concern  . None   Social History Narrative  . None   Past Surgical History:  Procedure Laterality Date  . ABDOMINAL HYSTERECTOMY    . PARATHYROIDECTOMY Right 04/14/2015  . PARATHYROIDECTOMY Right 04/14/2015   Procedure: RIGHT INFERIOR PARATHYROIDECTOMY;  Surgeon: Armandina Gemma, MD;  Location: [facility];  Service: General;  Laterality: Right;  . PARTIAL HYSTERECTOMY    . TUBAL LIGATION     Past Medical History:  Diagnosis Date  . Anxiety   . Current smoker   . Depression   . GERD (gastroesophageal reflux disease)   . HLD (hyperlipidemia)   . IBS (irritable bowel syndrome)   . Migraine   . Sleep apnea    BP 110/80   Pulse (!) 112   SpO2 98%   Opioid Risk Score:   Fall Risk Score:  `1  Depression screen PHQ 2/9  Depression screen PHQ 2/9 02/18/2017  Decreased Interest 3  Down, Depressed, Hopeless 3  PHQ - 2  Score 6  Altered sleeping 3  Tired, decreased energy 3  Change in appetite 1  Feeling bad or failure about yourself  3  Trouble concentrating 1  Moving slowly or fidgety/restless 1  Suicidal thoughts 1  PHQ-9 Score 19     Review of Systems  Constitutional: Positive for unexpected weight change.  HENT: Negative.   Eyes: Negative.   Respiratory: Positive for apnea, cough and wheezing.   Cardiovascular: Negative.   Gastrointestinal: Positive for constipation, diarrhea and nausea.  Endocrine: Negative.   Genitourinary: Negative.   Musculoskeletal: Positive for neck pain and neck stiffness.  Skin: Negative.   Allergic/Immunologic: Negative.     Neurological: Positive for dizziness, weakness, numbness and headaches.       Tingling  Hematological: Negative.   Psychiatric/Behavioral: Positive for dysphoric mood. The patient is nervous/anxious.   All other systems reviewed and are negative.     Objective:   Physical Exam Gen: NAD. Vital signs reviewed HENT: Normocephalic, Atraumatic Eyes: EOMI. No discharge.  Cardio: RRR. No JVD. Pulm: B/l clear to auscultation.  Effort normal Abd: Soft, BS+ MSK:  Gait WNL.   No edema.   +TTP right >L periorbital and temporal region Neuro:   Sensation intact to light touch in face  Strength  5/5 in all UE myotomes Skin: Warm and Dry. Intact Psych: Slightly anxious, tearful.     Assessment & Plan:  56 y/o anxiety, depression presents for follow up of post-concussive syndrome.   1. Post-concussive syndrome  MRI head reviewed, no acute process.  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   Will wean Propranolol 30mg  TID due to lack of efficacy  Will change Elavil to 25mg  qhs (pt unable to tolerate 50mg )  Will order Topamax 25qhs and increase by 25mg /week  Encouraged decrease in caffeine intake  Will order Lidoderm patches  Cont Psychology follow up  Will consider Imitrex and Botox in future   2. Sleep disturbance  Cont Elavil   Will order Melatonin qhs  Will not use Trazodone due to Celexa  3. Nausea  Cont Zofran 8mg  TID PRN  4. Tobacco abuse  Educated on abstinence  5. Reactive depression  Celexa per PCP  >40 minutes spent with patient and case manager, with >35 minutes spent in educating and counseling.  Pt not to return to work until symptoms resolve as this could prolong recovery and interfere with productivity.

## 2017-04-14 NOTE — Addendum Note (Signed)
Addended by: Delice Lesch A on: 04/14/2017 10:36 AM   Modules accepted: Level of Service

## 2017-04-14 NOTE — Patient Instructions (Signed)
Wean Propranolol 30mg  to twice a day for 3 days, daily three days, 15mg  daily for 3 days, then stop.

## 2017-04-20 ENCOUNTER — Other Ambulatory Visit (HOSPITAL_COMMUNITY): Payer: Self-pay | Admitting: *Deleted

## 2017-04-21 ENCOUNTER — Other Ambulatory Visit: Payer: Self-pay | Admitting: Obstetrics and Gynecology

## 2017-04-21 DIAGNOSIS — Z1231 Encounter for screening mammogram for malignant neoplasm of breast: Secondary | ICD-10-CM

## 2017-04-22 ENCOUNTER — Encounter: Payer: Self-pay | Admitting: Psychology

## 2017-04-22 NOTE — Progress Notes (Signed)
Neuropsychological Consultation   Patient:   Michele Townsend   DOB:   [birth year]  MR Number:  [id number]  Location:  [facility] [address] [place] [id number] [place] [id number] Dept: [phone]           Date of Service:   04/13/2007  Start Time:   8 AM End Time:   9 AM  Provider/Observer:  Ilean Skill, Psy.D.       Clinical Neuropsychologist       Billing Code/Service: [phone] 4 Units  Chief Complaint:    The patient's primary complaint has to do with the development of post concussion headaches or posttraumatic headaches.  Reason for Service:  The patient is a 56 year old female who is referred by Dr. Posey Pronto for psychological/neuropsychological consultation. The patient fell at work and struck her head and began developing significant symptoms related to this posttraumatic event. The patient reports that her biggest symptom is a very sharp dull tingling sensation and pain on the right side of her head. She reports that she will get very lightheaded with these events and sometimes experiencing blurry vision or other vision changes. She also describes a feeling of numbness as well as nausea with these significant headaches. She reports these headaches started in November 2017. She reports that these have had a dramatic in encompassing effect on most all aspects of her life.  The patient reports that she was at work in November 2017 when she tripped on a rug and hit her head and automotive door. The patient reports that when she struck her head that her head was knocked to the left very sharply and the door caught the right side of her face and right eye. She reports that she remembers striking the door and did not have any sick loss of consciousness. The patient reports that she was initially sent home with ibuprofen but her headache Getting worse. She reports that she then went  to [facility] urgent care where they diagnosed her with concussion. The patient reports that now she gets significant debilitating headaches that also associated with nausea, blurred vision, lightheadedness, and photophobia.  The patient reports that when she gets the nausea she will also have blurred vision before where she will "C spots." She reports that the spots and visual disturbance often will move that she tries to look at them when they are not in her primary visual field. She reports that she is not having significant effects on her memory or other issues around concentration that are not directly associated with episodes of her headache and head pain. She reports that these headaches have "taking everything out of her life." She reports that she feels drained and has no motivation and feels useless. She reports that she has these headaches every day and that the medication interventions at this point does not seem to help very much. The patient reports that she has seen a vascular specialist told her that the changes are primarily in the right.  Current Status:  The patient describes moderate to significant symptoms of depression, anxiety, mood changes, appetite disturbance, sleep changes, inability to work, racing thoughts, insomnia, confusion and memory issues, loss of interest, irritability, excessive worrying, suicidal ideation without plan, low energy and fatigue, obsessive and intrusive thinking and poor concentration.  Reliability of Information: Information is provided from my 1 hour clinical interview with the patient as well as review of available medical records.  Behavioral Observation: SHAQUELA WEICHERT  presents as a 56 y.o.-year-old Right Caucasian Female who appeared her stated age. her dress was Appropriate and she was Well Groomed and her manners were Appropriate to the situation.  her participation was indicative of Appropriate behaviors.  There were physical disabilities noted related  to gait and posture that appeared to be directly associated and connected to acute pain symptoms.  she displayed an appropriate level of cooperation and motivation.     Interactions:    Active Appropriate  Attention:   abnormal and attention span appeared shorter than expected for age  Memory:   within normal limits; recent and remote memory intact  Visuo-spatial:  within normal limits  Speech (Volume):  normal  Speech:   normal;   Thought Process:  Coherent and Relevant  Though Content:  WNL; while the patient reports that she has had suicidal ideation that these feelings have been more focused on an overwhelming desire to escape the current overwhelming debilitating symptoms that she has. The patient reports that she has not developed any specific plan or attempted to act on these feelings.  Orientation:   person, place, time/date and situation  Judgment:   Good  Planning:   Good  Affect:    Anxious and Depressed  Mood:    Anxious and Depressed  Insight:   Good  Intelligence:   high  Marital Status/Living: The patient was born and raised in [place]. The patient's mother is still alive but her father passed away a few years ago. The patient is currently temporarily living with her son and daughter-in-law. Her husband died in 02-08-23 of this year due to heart and kidney failure. She has a 59 year old son.  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  Past Employment:    Substance Use:  No concerns of substance abuse are reported.    Education:   HS Graduate  Medical History:   Past Medical History:  Diagnosis Date  . Anxiety   . Current smoker   . Depression   . GERD (gastroesophageal reflux disease)   . HLD (hyperlipidemia)   . IBS (irritable bowel syndrome)   . Migraine   . Sleep apnea            Psychiatric History:  The patient denies any prior psychiatric history.  Family  Med/Psych History: No family history on file.  Risk of Suicide/Violence: low the patient does acknowledge times where she has had suicidal ideation but in discussion with the patient at this issue they appeared to be more related to a feeling of desiring to escape from her situation. The patient reports that she has had a very stressful time recently. The first major issue was her fall at work and debilitating posttraumatic headaches developing afterwards. This happened in November 2017. The patient's husband then passed away in 08-Feb-2017 which was also another big stressor for her. Now the patient is coping with temporarily living with her son and daughter-in-law and coping with daily severe posttraumatic migraines.  Impression/DX:  The patient is a 56 year old female who describes a significant fall at work in which she tripped on something and fell into some large metal doors. She struck her head and face on the side and calls injury and swelling to the right side of her face and right eye. This impact also twisted her head to the side abruptly. The patient was initially diagnosed with a concussion by  the urgent care center and she has continued with what appear to be and are consistent with posttraumatic migraine. She describes photophobia and nausea along with the severe head pain. These descriptions do sound to be more related to vascular changes in events rather than musculoskeletal.  Disposition/Plan:  We will work with patient on issues around the posttraumatic headache and posttraumatic concussion syndrome.  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

## 2017-04-26 ENCOUNTER — Other Ambulatory Visit: Payer: Self-pay | Admitting: Physical Medicine & Rehabilitation

## 2017-04-26 ENCOUNTER — Telehealth: Payer: Self-pay

## 2017-04-26 NOTE — Telephone Encounter (Signed)
Michele Townsend called, stated that she is still in large amounts of pain, states is getting better sleep at night but is still in pain, please advise

## 2017-04-26 NOTE — Telephone Encounter (Signed)
The Topomax will take time and I discussed with her on the last visit that it cannot be titrated too quickly.  Thanks.

## 2017-04-27 NOTE — Telephone Encounter (Signed)
Left message for patient informing the patient to give Topomax a little more time

## 2017-04-28 ENCOUNTER — Ambulatory Visit
Admission: RE | Admit: 2017-04-28 | Discharge: 2017-04-28 | Disposition: A | Discharge status: Home / Self Care | Payer: No Typology Code available for payment source | Source: Ambulatory Visit | Attending: Obstetrics and Gynecology | Admitting: Obstetrics and Gynecology

## 2017-04-28 ENCOUNTER — Ambulatory Visit (HOSPITAL_COMMUNITY)
Admission: RE | Admit: 2017-04-28 | Discharge: 2017-04-28 | Disposition: A | Discharge status: Home / Self Care | Payer: Self-pay | Source: Ambulatory Visit | Attending: Obstetrics and Gynecology | Admitting: Obstetrics and Gynecology

## 2017-04-28 ENCOUNTER — Encounter (HOSPITAL_COMMUNITY): Payer: Self-pay

## 2017-04-28 VITALS — BP 122/84 | Temp 97.9°F | Ht 65.0 in | Wt 210.8 lb

## 2017-04-28 DIAGNOSIS — Z1239 Encounter for other screening for malignant neoplasm of breast: Secondary | ICD-10-CM

## 2017-04-28 DIAGNOSIS — Z1231 Encounter for screening mammogram for malignant neoplasm of breast: Secondary | ICD-10-CM

## 2017-04-28 NOTE — Progress Notes (Signed)
No complaints today.   Pap Smear: Pap smear not completed today. Last Pap smear was 2.5-3 years ago and normal per patient. Per patient has no history of an abnormal Pap smear. Patient has a history of a hysterectomy 17 years ago due to AUB. Patient no longer needs Pap smears due to her history of a hysterectomy for benign reasons per BCCCP and ACOG guidelines. No Pap smear results are in EPIC.  Physical exam: Breasts Breasts symmetrical. Sores under bilateral breasts that are healing. No nipple retraction bilateral breasts. No nipple discharge bilateral breasts. No lymphadenopathy. No lumps palpated bilateral breasts. No complaints of pain or tenderness on exam. Referred patient to the [facility] for a screening mammogram. Appointment scheduled for Thursday, Apr 28, 2017 at 1110.        Pelvic/Bimanual No Pap smear completed today since patient has a history of a hysterectomy for benign reasons. Pap smear not indicated per BCCCP guidelines.   Smoking History: Patient is a current smoker. Discussed smoking cessation with patient. Referred patient to the [facility] Quitline and gave resources to free smoking cessation classes at [facility].  Patient Navigation: Patient education provided. Access to services provided for patient through [facility] program.   Colorectal Cancer Screening: Per patient had a colonoscopy completed 2-3 years ago. No complaints today. FIT Test given to patient to complete and return to BCCCP.

## 2017-04-28 NOTE — Patient Instructions (Signed)
Explained breast self awareness with Michele Townsend. Patient did not need a Pap smear today due to her history of a hysterectomy for benign reasons. Let patient know that she doesn't need any further Pap smears due to her history of a hysterectomy for benign reasons. Referred [facility] to [facility] for a screening mammogram. Appointment scheduled for Thursday, Apr 28, 2017 at 1110. Let patient know the [facility] will follow up with her within the next couple weeks with results of mammogram by letter or phone. Discussed smoking cessation with patient. Referred patient to the [facility] Quitline and gave resources to free smoking cessation classes at [facility].  Michele Townsend verbalized understanding.  [place], Michele Chaco, RN 10:44 AM

## 2017-04-29 ENCOUNTER — Encounter: Payer: Worker's Compensation | Admitting: Physical Medicine & Rehabilitation

## 2017-04-29 ENCOUNTER — Other Ambulatory Visit: Payer: Self-pay | Admitting: Obstetrics and Gynecology

## 2017-04-29 ENCOUNTER — Encounter (HOSPITAL_COMMUNITY): Payer: Self-pay | Admitting: *Deleted

## 2017-05-02 ENCOUNTER — Other Ambulatory Visit: Payer: Self-pay | Admitting: Obstetrics and Gynecology

## 2017-05-02 DIAGNOSIS — R928 Other abnormal and inconclusive findings on diagnostic imaging of breast: Secondary | ICD-10-CM

## 2017-05-03 ENCOUNTER — Encounter: Payer: Worker's Compensation | Attending: Psychology | Admitting: Psychology

## 2017-05-03 DIAGNOSIS — F0781 Postconcussional syndrome: Secondary | ICD-10-CM | POA: Diagnosis not present

## 2017-05-03 DIAGNOSIS — G43709 Chronic migraine without aura, not intractable, without status migrainosus: Secondary | ICD-10-CM

## 2017-05-03 DIAGNOSIS — G479 Sleep disorder, unspecified: Secondary | ICD-10-CM | POA: Diagnosis not present

## 2017-05-03 DIAGNOSIS — G894 Chronic pain syndrome: Secondary | ICD-10-CM

## 2017-05-06 ENCOUNTER — Encounter (HOSPITAL_BASED_OUTPATIENT_CLINIC_OR_DEPARTMENT_OTHER): Payer: Worker's Compensation | Admitting: Physical Medicine & Rehabilitation

## 2017-05-06 ENCOUNTER — Ambulatory Visit
Admission: RE | Admit: 2017-05-06 | Discharge: 2017-05-06 | Disposition: A | Discharge status: Home / Self Care | Payer: No Typology Code available for payment source | Source: Ambulatory Visit | Attending: Obstetrics and Gynecology | Admitting: Obstetrics and Gynecology

## 2017-05-06 ENCOUNTER — Encounter: Payer: Self-pay | Admitting: Physical Medicine & Rehabilitation

## 2017-05-06 VITALS — BP 116/82 | HR 92 | Resp 14

## 2017-05-06 DIAGNOSIS — Z720 Tobacco use: Secondary | ICD-10-CM | POA: Diagnosis not present

## 2017-05-06 DIAGNOSIS — G894 Chronic pain syndrome: Secondary | ICD-10-CM | POA: Diagnosis not present

## 2017-05-06 DIAGNOSIS — G479 Sleep disorder, unspecified: Secondary | ICD-10-CM | POA: Diagnosis not present

## 2017-05-06 DIAGNOSIS — F1520 Other stimulant dependence, uncomplicated: Secondary | ICD-10-CM | POA: Diagnosis not present

## 2017-05-06 DIAGNOSIS — F0781 Postconcussional syndrome: Secondary | ICD-10-CM | POA: Diagnosis not present

## 2017-05-06 DIAGNOSIS — R928 Other abnormal and inconclusive findings on diagnostic imaging of breast: Secondary | ICD-10-CM

## 2017-05-06 DIAGNOSIS — G8929 Other chronic pain: Secondary | ICD-10-CM | POA: Diagnosis not present

## 2017-05-06 DIAGNOSIS — G43709 Chronic migraine without aura, not intractable, without status migrainosus: Secondary | ICD-10-CM

## 2017-05-06 MED ORDER — TOPIRAMATE 50 MG PO TABS: 50.0000 mg | ORAL_TABLET | Freq: Two times a day (BID) | ORAL | 1 refills | Status: DC

## 2017-05-06 MED ORDER — MELATONIN 1 MG PO TABS
0.5000 mg | ORAL_TABLET | Freq: Every day | ORAL | 1 refills | Status: DC
Start: 2017-05-06 — End: 2017-07-04

## 2017-05-06 MED ORDER — ONDANSETRON HCL 8 MG PO TABS: 8.0000 mg | ORAL_TABLET | Freq: Three times a day (TID) | ORAL | 1 refills | Status: DC | PRN

## 2017-05-06 MED ORDER — SCOPOLAMINE 1 MG/3DAYS TD PT72: 1.0000 | MEDICATED_PATCH | TRANSDERMAL | 12 refills | Status: DC

## 2017-05-06 MED ORDER — AMITRIPTYLINE HCL 50 MG PO TABS: 50.0000 mg | ORAL_TABLET | Freq: Every day | ORAL | 1 refills | Status: DC

## 2017-05-06 MED ORDER — LIDOCAINE 5 % EX PTCH: 1.0000 | MEDICATED_PATCH | CUTANEOUS | 1 refills | Status: DC

## 2017-05-06 MED ORDER — PROPRANOLOL HCL 10 MG PO TABS: 10.0000 mg | ORAL_TABLET | Freq: Three times a day (TID) | ORAL | 1 refills | Status: DC

## 2017-05-06 MED ORDER — SUMATRIPTAN SUCCINATE 50 MG PO TABS: 50.0000 mg | ORAL_TABLET | Freq: Every day | ORAL | 0 refills | Status: DC | PRN

## 2017-05-06 NOTE — Patient Instructions (Signed)
Pt to stay out of work until next office visit

## 2017-05-06 NOTE — Progress Notes (Addendum)
Subjective:    Patient ID: Michele Townsend, female    DOB: 1961-03-25, 56 y.o.   MRN: [id number]  HPI  56 y/o anxiety, depression presents for follow up of post-concussive syndrome.   Initially stated: Located in right temple after a fall at work.  Started 11/07/16.  Denies alleviating factors.  Oxycodone briefly improves the pain.  Bending over exacerbates the pain .She has associated nausea. Constant. Some photophobia.  Denies phonophobia. Dull, sharp, pounding.  Non-radiating. Ibu, tylenol do not seem to help.  Zofran did not help.  Pain inhibits anything she likes.  Denies falls. Of notes pt's husband passed away in 02-02-2023.   Last clinic visit 04/14/17.  Since last visit, she weaned her Propranolol without benefit.  She is doing okay with Elavil with some benefit.  Pt is a little confused about her discussion with Psychology.  She has quit smoking and is doing vape.  She is on decaffeinated tea as well. She is taking 25mg  BID of Topamax. She is using Lidoderm patches without benefit.  Her sleep has improved with melatonin and Elavil. Nausea is slightly better.   Pain Inventory Average Pain 8 Pain Right Now 8 My pain is intermittent, constant, sharp, burning, dull, stabbing, tingling and aching  In the last 24 hours, has pain interfered with the following? General activity 7 Relation with others 7 Enjoyment of life 9 What TIME of day is your pain at its worst? all Sleep (in general) Fair  Pain is worse with: walking, bending, sitting, inactivity and standing Pain improves with: nothing Relief from Meds: 0  Mobility walk without assistance how many minutes can you walk? 30 ability to climb steps?  yes do you drive?  yes transfers alone Do you have any goals in this area?  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  yes  Neuro/Psych weakness numbness tingling dizziness confusion depression anxiety  Prior  Studies Any changes since last visit?  no  Physicians involved in your care Any changes since last visit?  no   Family History  Problem Relation Age of Onset  . Hypertension Mother   . Hypertension Sister   . Breast cancer Maternal Aunt   . Breast cancer Maternal Aunt   of concussion.  Social History   Social History  . Marital status: Married    Spouse name: N/A  . Number of children: N/A  . Years of education: N/A   Social History Main Topics  . Smoking status: Current Every Day Smoker    Packs/day: 0.50    Types: Cigarettes  . Smokeless tobacco: Never Used  . Alcohol use No  . Drug use: No  . Sexual activity: Yes    Birth control/ protection: Surgical   Other Topics Concern  . None   Social History Narrative  . None   Past Surgical History:  Procedure Laterality Date  . ABDOMINAL HYSTERECTOMY    . PARATHYROIDECTOMY Right 04/14/2015  . PARATHYROIDECTOMY Right 04/14/2015   Procedure: RIGHT INFERIOR PARATHYROIDECTOMY;  Surgeon: Armandina Gemma, MD;  Location: [facility];  Service: General;  Laterality: Right;  . PARTIAL HYSTERECTOMY    . TUBAL LIGATION     Past Medical History:  Diagnosis Date  . Anxiety   . Current smoker   . Depression   . GERD (gastroesophageal reflux disease)   . HLD (hyperlipidemia)   . IBS (irritable bowel syndrome)   . Migraine   .  Sleep apnea    BP 116/82 (BP Location: Left Arm, Patient Position: Sitting, Cuff Size: Normal)   Pulse 92   Resp 14   SpO2 97%   Opioid Risk Score:   Fall Risk Score:  `1  Depression screen PHQ 2/9  Depression screen PHQ 2/9 02/18/2017  Decreased Interest 3  Down, Depressed, Hopeless 3  PHQ - 2 Score 6  Altered sleeping 3  Tired, decreased energy 3  Change in appetite 1  Feeling bad or failure about yourself  3  Trouble concentrating 1  Moving slowly or fidgety/restless 1  Suicidal thoughts 1  PHQ-9 Score 19     Review of Systems  Constitutional: Positive for unexpected weight change.  HENT:  Negative.   Eyes: Negative.   Respiratory: Positive for apnea, cough and wheezing.   Cardiovascular: Negative.   Gastrointestinal: Positive for nausea.  Endocrine: Negative.   Genitourinary: Negative.   Musculoskeletal: Positive for neck pain and neck stiffness.  Skin: Negative.   Allergic/Immunologic: Negative.   Neurological: Positive for dizziness, weakness, numbness and headaches.       Tingling  Hematological: Negative.   Psychiatric/Behavioral: Positive for confusion and dysphoric mood. The patient is nervous/anxious.   All other systems reviewed and are negative.     Objective:   Physical Exam Gen: NAD. Vital signs reviewed HENT: Normocephalic, Atraumatic Eyes: EOMI. No discharge.  Cardio: RRR. No JVD. Pulm: B/l clear to auscultation.  Effort normal Abd: Soft, BS+ MSK:  Gait WNL.   No edema.   +TTP right >L periorbital and temporal region Neuro:   Sensation intact to light touch in face  Strength  5/5 in all UE myotomes Skin: Warm and Dry. Intact Psych: Anxious, flat.    Assessment & Plan:  56 y/o anxiety, depression presents for follow up of post-concussive syndrome.   1. Post-concussive syndrome  MRI head reviewed, no acute process.  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   Will start Propranolol 10mg   Will increase Elavil to 50mg  qhs   Will increase Topamax to 50 BID  Cont decreased caffeine intake  Cont Lidoderm patches  Cont Psychology follow up - will speak with   Will order Sumatriptan 50 PRN  Will consider Botox in future  Will trial scopolamine patch   2. Sleep disturbance  Cont Elavil   Cont Melatonin qhs  Will not use Trazodone due to Celexa  3. Nausea  Cont Zofran 8mg  TID PRN  4. Tobacco abuse  Pt has quit smoking, cont to abstein  5. Reactive depression  Celexa per PCP

## 2017-05-12 ENCOUNTER — Encounter: Payer: Self-pay | Admitting: Psychology

## 2017-05-12 ENCOUNTER — Ambulatory Visit: Payer: Self-pay | Admitting: Physical Medicine & Rehabilitation

## 2017-05-12 LAB — FECAL OCCULT BLOOD, IMMUNOCHEMICAL: FECAL OCCULT BLD: NEGATIVE

## 2017-05-12 NOTE — Progress Notes (Signed)
Patient:  Michele Townsend   DOB: 23-Nov-1961  MR Number: [id number]  Location: [facility] [address] [place] [id number] [place] [address] Dept: [phone]  Start: 10 AM End: 11 AM  Provider/Observer:     Edgardo Roys PSYD  Chief Complaint:      Chief Complaint  Patient presents with  . Memory Loss  . Headache  . Pain  . Anxiety    Reason For Service:     The patient is a 56 year old female who is referred by Dr. Posey Pronto for psychological/neuropsychological consultation. The patient fell at work and struck her head and began developing significant symptoms related to this posttraumatic event. The patient reports that her biggest symptom is a very sharp dull tingling sensation and pain on the right side of her head. She reports that she will get very lightheaded with these events and sometimes experiencing blurry vision or other vision changes. She also describes a feeling of numbness as well as nausea with these significant headaches. She reports these headaches started in November 2017. She reports that these have had a dramatic in encompassing effect on most all aspects of her life.  The patient reports that she was at work in November 2017 when she tripped on a rug and hit her head and automotive door. The patient reports that when she struck her head that her head was knocked to the left very sharply and the door caught the right side of her face and right eye. She reports that she remembers striking the door and did not have any sick loss of consciousness. The patient reports that she was initially sent home with ibuprofen but her headache Getting worse. She reports that she then went to [facility] urgent care where they diagnosed her with concussion. The patient reports that now she gets significant debilitating headaches that also associated with nausea, blurred vision,  lightheadedness, and photophobia.  The patient reports that when she gets the nausea she will also have blurred vision before where she will "C spots." She reports that the spots and visual disturbance often will move that she tries to look at them when they are not in her primary visual field. She reports that she is not having significant effects on her memory or other issues around concentration that are not directly associated with episodes of her headache and head pain. She reports that these headaches have "taking everything out of her life." She reports that she feels drained and has no motivation and feels useless. She reports that she has these headaches every day and that the medication interventions at this point does not seem to help very much. The patient reports that she has seen a vascular specialist told her that the changes are primarily in the right.  Interventions Strategy:  Cognitive/behavioral psychotherapeutic interventions along with issues related to her pain management and chronic pain along with postconcussion syndrome interventions  Participation Level:   Active  Participation Quality:  Appropriate      Behavioral Observation:  Well Groomed, Confused, and Appropriate.   Current Psychosocial Factors: The patient reports that she has continued to struggle with significant headaches and trying to keep up with everything in her life. The patient describes being very fatigued and having difficulties with motivation.  Content of Session:   Reviewed current symptoms and worked on therapeutic interventions.  Current Status:   The patient reports continued but improved issues with  her transformed migraines and sleep disturbance but she continues to have residual chronic pain and postconcussion symptoms.  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  Last  Reviewed:   05/03/2017  Impression/Diagnosis:   The patient is a 56 year old female who describes a significant fall at work in which she tripped on something and fell into some large metal doors. She struck her head and face on the side and calls injury and swelling to the right side of her face and right eye. This impact also twisted her head to the side abruptly. The patient was initially diagnosed with a concussion by the urgent care center and she has continued with what appear to be and are consistent with posttraumatic migraine. She describes photophobia and nausea along with the severe head pain. These descriptions do sound to be more related to vascular changes in events rather than musculoskeletal.  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

## 2017-05-16 ENCOUNTER — Encounter (HOSPITAL_COMMUNITY): Payer: Self-pay | Admitting: *Deleted

## 2017-05-16 NOTE — Progress Notes (Signed)
Letter mailed with Fit Test results.

## 2017-05-17 ENCOUNTER — Encounter (HOSPITAL_COMMUNITY): Payer: Self-pay | Admitting: *Deleted

## 2017-05-17 NOTE — Progress Notes (Signed)
Letter mailed to patient with negative Fit Test results.  

## 2017-05-24 ENCOUNTER — Encounter: Payer: Worker's Compensation | Attending: Psychology | Admitting: Psychology

## 2017-05-24 DIAGNOSIS — F419 Anxiety disorder, unspecified: Secondary | ICD-10-CM | POA: Insufficient documentation

## 2017-05-24 DIAGNOSIS — F0781 Postconcussional syndrome: Secondary | ICD-10-CM | POA: Diagnosis present

## 2017-05-24 DIAGNOSIS — G479 Sleep disorder, unspecified: Secondary | ICD-10-CM | POA: Diagnosis not present

## 2017-05-24 DIAGNOSIS — G43709 Chronic migraine without aura, not intractable, without status migrainosus: Secondary | ICD-10-CM | POA: Diagnosis not present

## 2017-05-24 DIAGNOSIS — G894 Chronic pain syndrome: Secondary | ICD-10-CM | POA: Diagnosis not present

## 2017-05-30 ENCOUNTER — Other Ambulatory Visit: Payer: Self-pay | Admitting: Physical Medicine & Rehabilitation

## 2017-05-31 ENCOUNTER — Encounter: Payer: Self-pay | Admitting: Psychology

## 2017-05-31 NOTE — Progress Notes (Signed)
Patient:  Michele Townsend   DOB: 1961/08/12  MR Number: [id number]  Location: [facility] [address] [place] [id number] [place] [address] Dept: [phone]  Start: 10 AM End: 11 AM  Provider/Observer:     Edgardo Roys PSYD  Chief Complaint:      Chief Complaint  Patient presents with  . Memory Loss  . Pain  . Anxiety    Reason For Service:     The patient is a 56 year old female who is referred by Dr. Posey Pronto for psychological/neuropsychological consultation. The patient fell at work and struck her head and began developing significant symptoms related to this posttraumatic event. The patient reports that her biggest symptom is a very sharp dull tingling sensation and pain on the right side of her head. She reports that she will get very lightheaded with these events and sometimes experiencing blurry vision or other vision changes. She also describes a feeling of numbness as well as nausea with these significant headaches. She reports these headaches started in November 2017. She reports that these have had a dramatic in encompassing effect on most all aspects of her life.  The patient reports that she was at work in November 2017 when she tripped on a rug and hit her head and automotive door. The patient reports that when she struck her head that her head was knocked to the left very sharply and the door caught the right side of her face and right eye. She reports that she remembers striking the door and did not have any sick loss of consciousness. The patient reports that she was initially sent home with ibuprofen but her headache Getting worse. She reports that she then went to [facility] urgent care where they diagnosed her with concussion. The patient reports that now she gets significant debilitating headaches that also associated with nausea, blurred vision,  lightheadedness, and photophobia.  The patient reports that when she gets the nausea she will also have blurred vision before where she will "C spots." She reports that the spots and visual disturbance often will move that she tries to look at them when they are not in her primary visual field. She reports that she is not having significant effects on her memory or other issues around concentration that are not directly associated with episodes of her headache and head pain. She reports that these headaches have "taking everything out of her life." She reports that she feels drained and has no motivation and feels useless. She reports that she has these headaches every day and that the medication interventions at this point does not seem to help very much. The patient reports that she has seen a vascular specialist told her that the changes are primarily in the right.  Interventions Strategy:  Cognitive/behavioral psychotherapeutic interventions along with issues related to her pain management and chronic pain along with postconcussion syndrome interventions  Participation Level:   Active  Participation Quality:  Appropriate      Behavioral Observation:  Well Groomed, Confused, and Appropriate.   Current Psychosocial Factors: The patient reports that she continues to be very frustrated by the symptoms of severe fatigue, severe chronic headache, and cognitive difficulties negatively impacting her ability to function in life. There is been a significant reduction in psychosocial interactions.  Content of Session:   Reviewed current symptoms and worked on therapeutic interventions.  Current Status:   The patient reports continued but  improved issues with her transformed migraines and sleep disturbance but she continues to have residual chronic pain and postconcussion symptoms.  The patient reports that all the medications that have been tried to this point have not been able to help alleviate her severe  recurring headache and postconcussion type symptoms.  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  Last Reviewed:   05/24/2017  Impression/Diagnosis:   The patient is a 56 year old female who describes a significant fall at work in which she tripped on something and fell into some large metal doors. She struck her head and face on the side and calls injury and swelling to the right side of her face and right eye. This impact also twisted her head to the side abruptly. The patient was initially diagnosed with a concussion by the urgent care center and she has continued with what appear to be and are consistent with posttraumatic migraine. She describes photophobia and nausea along with the severe head pain. These descriptions do sound to be more related to vascular changes in events rather than musculoskeletal.  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

## 2017-06-01 ENCOUNTER — Telehealth: Payer: Self-pay | Admitting: Psychology

## 2017-06-01 NOTE — Telephone Encounter (Signed)
RECD ROI FROM [facility] REQ FOR ROI MED [facility] /PHYSICIAN NOTES AND DR OPINION FROM J RODENBOUGH. [facility] REC AND REQUESTED DATA TO ATTYS  ALSO RECD SIGNED REQ FROM [facility] AT [place] REC'DS OF OFFICE VISITS - FAXED TO [facility] - REQ FOR ROI TO [place]

## 2017-06-09 ENCOUNTER — Encounter
Payer: Worker's Compensation | Attending: Physical Medicine & Rehabilitation | Admitting: Physical Medicine & Rehabilitation

## 2017-06-09 ENCOUNTER — Encounter: Payer: Self-pay | Admitting: Physical Medicine & Rehabilitation

## 2017-06-09 VITALS — BP 121/81 | HR 92 | Resp 14

## 2017-06-09 DIAGNOSIS — G4730 Sleep apnea, unspecified: Secondary | ICD-10-CM | POA: Diagnosis not present

## 2017-06-09 DIAGNOSIS — G479 Sleep disorder, unspecified: Secondary | ICD-10-CM

## 2017-06-09 DIAGNOSIS — F329 Major depressive disorder, single episode, unspecified: Secondary | ICD-10-CM | POA: Diagnosis not present

## 2017-06-09 DIAGNOSIS — R110 Nausea: Secondary | ICD-10-CM | POA: Insufficient documentation

## 2017-06-09 DIAGNOSIS — R42 Dizziness and giddiness: Secondary | ICD-10-CM | POA: Insufficient documentation

## 2017-06-09 DIAGNOSIS — F0781 Postconcussional syndrome: Secondary | ICD-10-CM

## 2017-06-09 DIAGNOSIS — F17210 Nicotine dependence, cigarettes, uncomplicated: Secondary | ICD-10-CM | POA: Diagnosis not present

## 2017-06-09 DIAGNOSIS — H53149 Visual discomfort, unspecified: Secondary | ICD-10-CM | POA: Diagnosis not present

## 2017-06-09 DIAGNOSIS — R200 Anesthesia of skin: Secondary | ICD-10-CM | POA: Diagnosis not present

## 2017-06-09 DIAGNOSIS — G894 Chronic pain syndrome: Secondary | ICD-10-CM | POA: Diagnosis not present

## 2017-06-09 DIAGNOSIS — G43709 Chronic migraine without aura, not intractable, without status migrainosus: Secondary | ICD-10-CM | POA: Diagnosis not present

## 2017-06-09 DIAGNOSIS — E785 Hyperlipidemia, unspecified: Secondary | ICD-10-CM | POA: Insufficient documentation

## 2017-06-09 DIAGNOSIS — R531 Weakness: Secondary | ICD-10-CM | POA: Diagnosis not present

## 2017-06-09 DIAGNOSIS — K219 Gastro-esophageal reflux disease without esophagitis: Secondary | ICD-10-CM | POA: Diagnosis not present

## 2017-06-09 DIAGNOSIS — K589 Irritable bowel syndrome without diarrhea: Secondary | ICD-10-CM | POA: Diagnosis not present

## 2017-06-09 DIAGNOSIS — Z720 Tobacco use: Secondary | ICD-10-CM

## 2017-06-09 DIAGNOSIS — F419 Anxiety disorder, unspecified: Secondary | ICD-10-CM | POA: Diagnosis not present

## 2017-06-09 MED ORDER — PROPRANOLOL HCL 20 MG PO TABS: 20.0000 mg | ORAL_TABLET | Freq: Three times a day (TID) | ORAL | 1 refills | Status: DC

## 2017-06-09 MED ORDER — TOPIRAMATE 100 MG PO TABS: 100.0000 mg | ORAL_TABLET | Freq: Two times a day (BID) | ORAL | 1 refills | Status: DC

## 2017-06-09 MED ORDER — AMITRIPTYLINE HCL 75 MG PO TABS: 75.0000 mg | ORAL_TABLET | Freq: Every day | ORAL | 1 refills | Status: DC

## 2017-06-09 NOTE — Patient Instructions (Signed)
Pt not to return to work until follow up appointment 

## 2017-06-09 NOTE — Progress Notes (Signed)
Subjective:    Patient ID: Michele Townsend, female    DOB: [birth year], 56 y.o.   MRN: [id number]  HPI  56 y/o anxiety, depression presents for follow up of post-concussive syndrome.   Initially stated: Located in right temple after a fall at work.  Started 11/07/16.  Denies alleviating factors.  Oxycodone briefly improves the pain.  Bending over exacerbates the pain .She has associated nausea. Constant. Some photophobia.  Denies phonophobia. Dull, sharp, pounding.  Non-radiating. Ibu, tylenol do not seem to help.  Zofran did not help.  Pain inhibits anything she likes.  Denies falls. Of notes pt's husband passed away in 02-10-2023.   Last clinic visit 05/06/17.  Since last visit, she states she tolerated the Elavil, Propranolol, Topomax, Sumatriptan.  However, she states it has made no difference in pain, but some improvement in sleep. Spoke with Psychologist regarding pt's concerns last visit.  Her nausea has improved.    Pain Inventory Average Pain 8 Pain Right Now 8 My pain is intermittent, constant, sharp, burning, dull, stabbing, tingling and aching  In the last 24 hours, has pain interfered with the following? General activity 7 Relation with others 7 Enjoyment of life 8 What TIME of day is your pain at its worst? all Sleep (in general) Fair  Pain is worse with: walking, bending, sitting, inactivity, standing and some activites Pain improves with: rest and pacing activities Relief from Meds: 0  Mobility walk without assistance how many minutes can you walk? 30 ability to climb steps?  yes do you drive?  yes Do you have any goals in this area?  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  yes  Neuro/Psych weakness numbness tingling trouble walking dizziness confusion depression anxiety  Prior Studies Any changes since last visit?  no  Physicians  involved in your care Any changes since last visit?  no   Family History  Problem Relation Age of Onset  . Hypertension Mother   . Hypertension Sister   . Breast cancer Maternal Aunt   . Breast cancer Maternal Aunt   of concussion.  Social History   Social History  . Marital status: Married    Spouse name: N/A  . Number of children: N/A  . Years of education: N/A   Social History Main Topics  . Smoking status: Current Every Day Smoker    Packs/day: 0.50    Types: Cigarettes  . Smokeless tobacco: Never Used  . Alcohol use No  . Drug use: No  . Sexual activity: Yes    Birth control/ protection: Surgical   Other Topics Concern  . None   Social History Narrative  . None   Past Surgical History:  Procedure Laterality Date  . ABDOMINAL HYSTERECTOMY    . PARATHYROIDECTOMY Right 04/14/2015  . PARATHYROIDECTOMY Right 04/14/2015   Procedure: RIGHT INFERIOR PARATHYROIDECTOMY;  Surgeon: Armandina Gemma, MD;  Location: [facility];  Service: General;  Laterality: Right;  . PARTIAL HYSTERECTOMY    . TUBAL LIGATION     Past Medical History:  Diagnosis Date  . Anxiety   . Current smoker   . Depression   . GERD (gastroesophageal reflux disease)   . HLD (hyperlipidemia)   . IBS (irritable bowel syndrome)   . Migraine   . Sleep apnea    BP 121/81 (BP Location: Left Arm, Patient Position: Sitting, Cuff Size: Large)   Pulse  92   Resp 14   SpO2 98%   Opioid Risk Score:   Fall Risk Score:  `1  Depression screen PHQ 2/9  Depression screen PHQ 2/9 02/18/2017  Decreased Interest 3  Down, Depressed, Hopeless 3  PHQ - 2 Score 6  Altered sleeping 3  Tired, decreased energy 3  Change in appetite 1  Feeling bad or failure about yourself  3  Trouble concentrating 1  Moving slowly or fidgety/restless 1  Suicidal thoughts 1  PHQ-9 Score 19     Review of Systems  Constitutional: Positive for unexpected weight change.  HENT: Negative.   Eyes: Negative.   Respiratory: Negative.     Cardiovascular: Negative.   Gastrointestinal: Positive for nausea.  Endocrine: Negative.   Genitourinary: Negative.   Musculoskeletal: Positive for gait problem, neck pain and neck stiffness.  Skin: Negative.   Allergic/Immunologic: Negative.   Neurological: Positive for dizziness, weakness, numbness and headaches.       Tingling  Hematological: Negative.   Psychiatric/Behavioral: Positive for confusion and dysphoric mood. The patient is nervous/anxious.   All other systems reviewed and are negative.     Objective:   Physical Exam Gen: NAD. Vital signs reviewed HENT: Normocephalic, Atraumatic Eyes: EOMI. No discharge.  Cardio: RRR. No JVD. Pulm: B/l clear to auscultation.  Effort normal  Abd: Soft, BS+ MSK:  Gait WNL.   No edema.   +TTP right >L periorbital and temporal region Neuro:   Sensation intact to light touch in face  Strength  5/5 in all UE myotomes Skin: Warm and Dry. Intact Psych: Anxious, flat.     Assessment & Plan:  56 y/o anxiety, depression presents for follow up of post-concussive syndrome.   1. Post-concussive syndrome  MRI head reviewed, no acute process.  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   Cont decreased caffeine intake  Cont Lidoderm patches  Cont Sumatriptan PRN  Will increase Propranolol 20mg  TID  Will increase Elavil to 75mg  qhs   Will increase Topamax 100 BID  Cont Psychology follow up   Will consider Botox in future   2. Sleep disturbance  Cont Elavil   Cont Melatonin qhs  Will not use Trazodone due to Celexa  3. Nausea  Cont Zofran 8mg  TID PRN  Scopolamine ineffective  4. Tobacco abuse  Pt has quit smoking, cont to abstein  5. Reactive depression  Celexa per PCP

## 2017-06-17 ENCOUNTER — Telehealth: Payer: Self-pay | Admitting: Physical Medicine & Rehabilitation

## 2017-06-17 MED ORDER — AMITRIPTYLINE HCL 50 MG PO TABS: 50.0000 mg | ORAL_TABLET | Freq: Every day | ORAL | 0 refills | Status: DC

## 2017-06-17 NOTE — Telephone Encounter (Signed)
She may decrease Amytriptaline back to 50mg .  Thanks

## 2017-06-17 NOTE — Telephone Encounter (Signed)
Contacted pt left message, reordered amitriptyline 50 mg #30

## 2017-06-17 NOTE — Telephone Encounter (Signed)
PATIENT STATES AP INCREASED AMYTRIPTYLINE, TOPAMAX AND CITALOPRAM(?)   SHE STATES THAT SHE IS HAVING DIFFICULTY TOLERATING EXTREME DRY MOUTH AND CONSTIPATION- SHE STATES SHE THOUGHT THE AMYTRIPTYLINE WAS ALREADY CAUSING SOME DRY MOUTH BEFORE BUT THE INCREASE IS EXTREMELY BAD - PLEASE CALL AND ADVISE WHAT SHE IS TO DO  [phone]

## 2017-06-20 NOTE — Telephone Encounter (Signed)
For constipation, she should try miralax BID and 2 tabs of senna-s at bedtime.  If her main concern is the dry mouth, we can reduce the dose of the Elavil further. Thanks.

## 2017-06-20 NOTE — Telephone Encounter (Signed)
(  see previous telephone call)Ms Bannister called again about her side effects of dry mouth and constipation.  She got the lower dose amitriptyline and nothing has changed.  She says that pharmacist says her constipation and dry mouth may be more likely from propanolol or Topamax. She cannot tolerate these side effects.  She has miralax but I suggested she try colace or senokot daily if she doesn't want to be bothered by the mixing and drinking miralax.  Please advise.

## 2017-06-20 NOTE — Telephone Encounter (Signed)
25mg .  Thanks

## 2017-06-20 NOTE — Telephone Encounter (Signed)
It is the dry mouth. Reduce amitriptyline to what dose?

## 2017-06-21 NOTE — Telephone Encounter (Signed)
Natallie notified.

## 2017-06-29 ENCOUNTER — Encounter: Payer: Self-pay | Admitting: Physical Medicine & Rehabilitation

## 2017-06-29 ENCOUNTER — Encounter
Payer: Worker's Compensation | Attending: Physical Medicine & Rehabilitation | Admitting: Physical Medicine & Rehabilitation

## 2017-06-29 VITALS — BP 118/81 | HR 66 | Resp 14

## 2017-06-29 DIAGNOSIS — F419 Anxiety disorder, unspecified: Secondary | ICD-10-CM | POA: Diagnosis not present

## 2017-06-29 DIAGNOSIS — I6521 Occlusion and stenosis of right carotid artery: Secondary | ICD-10-CM | POA: Diagnosis not present

## 2017-06-29 DIAGNOSIS — Z90711 Acquired absence of uterus with remaining cervical stump: Secondary | ICD-10-CM | POA: Insufficient documentation

## 2017-06-29 DIAGNOSIS — E890 Postprocedural hypothyroidism: Secondary | ICD-10-CM | POA: Diagnosis not present

## 2017-06-29 DIAGNOSIS — F329 Major depressive disorder, single episode, unspecified: Secondary | ICD-10-CM | POA: Diagnosis not present

## 2017-06-29 DIAGNOSIS — K219 Gastro-esophageal reflux disease without esophagitis: Secondary | ICD-10-CM | POA: Diagnosis not present

## 2017-06-29 DIAGNOSIS — E785 Hyperlipidemia, unspecified: Secondary | ICD-10-CM | POA: Diagnosis not present

## 2017-06-29 DIAGNOSIS — G43909 Migraine, unspecified, not intractable, without status migrainosus: Secondary | ICD-10-CM | POA: Diagnosis not present

## 2017-06-29 DIAGNOSIS — F0781 Postconcussional syndrome: Secondary | ICD-10-CM | POA: Insufficient documentation

## 2017-06-29 DIAGNOSIS — K589 Irritable bowel syndrome without diarrhea: Secondary | ICD-10-CM | POA: Insufficient documentation

## 2017-06-29 DIAGNOSIS — F17210 Nicotine dependence, cigarettes, uncomplicated: Secondary | ICD-10-CM | POA: Diagnosis not present

## 2017-06-29 DIAGNOSIS — R110 Nausea: Secondary | ICD-10-CM | POA: Insufficient documentation

## 2017-06-29 DIAGNOSIS — G43709 Chronic migraine without aura, not intractable, without status migrainosus: Secondary | ICD-10-CM | POA: Diagnosis not present

## 2017-06-29 DIAGNOSIS — G4730 Sleep apnea, unspecified: Secondary | ICD-10-CM | POA: Diagnosis not present

## 2017-06-29 NOTE — Progress Notes (Addendum)
Botox Injection for chronic migraine headaches ICD 10: G43.709  Dilution: 100 Units/ 27ml preservative free NS x2 Indication: refractory headaches (At least 15 days per month/headache lasting greater than 4 hours per day) incompletely responsive to other more conservative measures.  Informed consent was obtained after describing risks and benefits of the procedure with the patient. This includes bleeding, bruising, infection, excessive weakness, or medication side effects. A REMS form is on file and signed. Needle: 30g 1/2 inch needle  HPI: 56 y/o anxiety, depression presents for follow up of post-concussive syndrome with transformed migraines.  Number of units per muscle:  Right temporalis 20 units, 4 access points Left temporalis 40 units,  4 access points Right frontalis 10 units, 2 access points Left frontalis 10 units, 2 access points Procerus 5 units, 1 access point Right corrugator 5 units, 1 access point Left corrugator 5 units, 1 access point Right occipitalis 15 units, 3 access points Left occipitalis 15 units, 3 access points Right cervical paraspinal 10 units, 2 access points Left cervical paraspinals 10 units, 2 access points  Right trapezius 15 units, 3 access points Left trapezius 15 units, 3 access points   Remaining 25 units was discarded due to unavoidable waste.   Post procedure instructions were given. A followup appointment was made.  Plan: RTC 6 weeks Wean propanolol

## 2017-06-29 NOTE — Patient Instructions (Signed)
Patient to stay out of work until follow up appointment

## 2017-07-01 ENCOUNTER — Encounter: Payer: Self-pay | Admitting: Psychology

## 2017-07-01 ENCOUNTER — Encounter: Payer: Worker's Compensation | Attending: Psychology | Admitting: Psychology

## 2017-07-01 DIAGNOSIS — G479 Sleep disorder, unspecified: Secondary | ICD-10-CM | POA: Diagnosis not present

## 2017-07-01 DIAGNOSIS — G894 Chronic pain syndrome: Secondary | ICD-10-CM | POA: Diagnosis not present

## 2017-07-01 DIAGNOSIS — F0781 Postconcussional syndrome: Secondary | ICD-10-CM

## 2017-07-01 DIAGNOSIS — G43709 Chronic migraine without aura, not intractable, without status migrainosus: Secondary | ICD-10-CM | POA: Diagnosis not present

## 2017-07-01 NOTE — Progress Notes (Signed)
Patient:  Michele Townsend   DOB: February 01, 1961  MR Number: [id number]  Location: [facility] [address], [place] [id number] [place] [address] Dept: [phone]  Start: 11 AM End: 12 PM  Provider/Observer:     Edgardo Roys PSYD  Chief Complaint:      Chief Complaint  Patient presents with  . Migraine  . Headache    Reason For Service:     The patient is a 56 year old female who is referred by Dr. Posey Pronto for psychological/neuropsychological consultation. The patient fell at work and struck her head and began developing significant symptoms related to this posttraumatic event. The patient reports that her biggest symptom is a very sharp dull tingling sensation and pain on the right side of her head. She reports that she will get very lightheaded with these events and sometimes experiencing blurry vision or other vision changes. She also describes a feeling of numbness as well as nausea with these significant headaches. She reports these headaches started in November 2017. She reports that these have had a dramatic in encompassing effect on most all aspects of her life.  The patient reports that she was at work in November 2017 when she tripped on a rug and hit her head and automotive door. The patient reports that when she struck her head that her head was knocked to the left very sharply and the door caught the right side of her face and right eye. She reports that she remembers striking the door and did not have any sick loss of consciousness. The patient reports that she was initially sent home with ibuprofen but her headache Getting worse. She reports that she then went to [facility] urgent care where they diagnosed her with concussion. The patient reports that now she gets significant debilitating headaches that also associated with nausea, blurred vision, lightheadedness, and  photophobia.  The patient reports that when she gets the nausea she will also have blurred vision before where she will "C spots." She reports that the spots and visual disturbance often will move that she tries to look at them when they are not in her primary visual field. She reports that she is not having significant effects on her memory or other issues around concentration that are not directly associated with episodes of her headache and head pain. She reports that these headaches have "taking everything out of her life." She reports that she feels drained and has no motivation and feels useless. She reports that she has these headaches every day and that the medication interventions at this point does not seem to help very much. The patient reports that she has seen a vascular specialist told her that the changes are primarily in the right.  Interventions Strategy:  Cognitive/behavioral psychotherapeutic interventions along with issues related to her pain management and chronic pain along with postconcussion syndrome interventions.  Working on coping issues, pain management issues, and skills to reduce physiological tension.  Participation Level:   Active  Participation Quality:  Appropriate      Behavioral Observation:  Well Groomed, Confused, and Appropriate.   Current Psychosocial Factors: The patient reports that her chronic headache, fatigue, and nausea still have a negative impact on her getting out and doing things in her life.  Content of Session:   Reviewed current symptoms and worked on therapeutic interventions.  Current Status:   The patient reports that her efforts with Botox have  just started and she has not seen a response after the first trial.  She does report some numbness in face and head.  She reports that she is still having what sound like anticholinergic effects of the amitriptyline although it could be other factors including her migrainous headaches. The patient reports  that she has been frustrated that all of the different interventions have not been successful in managing her headache. However, she does report that her attention and concentration and cognitive function have been improving but it is likely that the degree of pain in sleep disturbance are having a deleterious effect on cognitive functioning.  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  Last Reviewed:   07/01/2017  Impression/Diagnosis:   The patient is a 56 year old female who describes a significant fall at work in which she tripped on something and fell into some large metal doors. She struck her head and face on the side and calls injury and swelling to the right side of her face and right eye. This impact also twisted her head to the side abruptly. The patient was initially diagnosed with a concussion by the urgent care center and she has continued with what appear to be and are consistent with posttraumatic migraine. She describes photophobia and nausea along with the severe head pain. These descriptions do sound to be more related to vascular changes in events rather than musculoskeletal.  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

## 2017-07-04 ENCOUNTER — Other Ambulatory Visit: Payer: Self-pay | Admitting: *Deleted

## 2017-07-04 MED ORDER — SUMATRIPTAN SUCCINATE 50 MG PO TABS: ORAL_TABLET | ORAL | 0 refills | Status: DC

## 2017-07-04 MED ORDER — MELATONIN 1 MG PO TABS: 0.5000 mg | ORAL_TABLET | Freq: Every day | ORAL | 1 refills | Status: DC

## 2017-07-04 MED ORDER — AMITRIPTYLINE HCL 25 MG PO TABS: 25.0000 mg | ORAL_TABLET | Freq: Every day | ORAL | 1 refills | Status: DC

## 2017-07-18 ENCOUNTER — Telehealth: Payer: Self-pay | Admitting: *Deleted

## 2017-07-18 NOTE — Telephone Encounter (Signed)
Patient called the case manager with complaints of left drooping eyelid, blurred vision.  Botox was given on 06/29/2017.  Please Advise

## 2017-07-18 NOTE — Telephone Encounter (Signed)
Unfortunately, this is a side effect of Botox, especially if pt applied pressure to the the areas or applied heat after injection.  The effects should wear off in a few weeks.  If she has a facial droop or slurred speech, or arm/leg weakness, she needs to go to the ED.

## 2017-07-19 NOTE — Telephone Encounter (Signed)
Contacted patient, she has made an appointment to be seen tomorrow 07/20/2017 with Dr. Posey Pronto for reassurance.  I contacted Bernadette Hoit, case manager, [place] and informed her as well via voicemail on her secured mailbox

## 2017-07-20 ENCOUNTER — Encounter: Payer: Self-pay | Admitting: Physical Medicine & Rehabilitation

## 2017-07-20 ENCOUNTER — Encounter
Payer: Worker's Compensation | Attending: Physical Medicine & Rehabilitation | Admitting: Physical Medicine & Rehabilitation

## 2017-07-20 VITALS — BP 130/80 | HR 65

## 2017-07-20 DIAGNOSIS — R410 Disorientation, unspecified: Secondary | ICD-10-CM | POA: Insufficient documentation

## 2017-07-20 DIAGNOSIS — G894 Chronic pain syndrome: Secondary | ICD-10-CM

## 2017-07-20 DIAGNOSIS — R51 Headache: Secondary | ICD-10-CM | POA: Diagnosis not present

## 2017-07-20 DIAGNOSIS — F419 Anxiety disorder, unspecified: Secondary | ICD-10-CM | POA: Insufficient documentation

## 2017-07-20 DIAGNOSIS — R42 Dizziness and giddiness: Secondary | ICD-10-CM | POA: Diagnosis not present

## 2017-07-20 DIAGNOSIS — F17210 Nicotine dependence, cigarettes, uncomplicated: Secondary | ICD-10-CM | POA: Insufficient documentation

## 2017-07-20 DIAGNOSIS — G4730 Sleep apnea, unspecified: Secondary | ICD-10-CM | POA: Insufficient documentation

## 2017-07-20 DIAGNOSIS — K589 Irritable bowel syndrome without diarrhea: Secondary | ICD-10-CM | POA: Insufficient documentation

## 2017-07-20 DIAGNOSIS — F0781 Postconcussional syndrome: Secondary | ICD-10-CM | POA: Diagnosis not present

## 2017-07-20 DIAGNOSIS — R110 Nausea: Secondary | ICD-10-CM | POA: Insufficient documentation

## 2017-07-20 DIAGNOSIS — Z720 Tobacco use: Secondary | ICD-10-CM | POA: Diagnosis not present

## 2017-07-20 DIAGNOSIS — G479 Sleep disorder, unspecified: Secondary | ICD-10-CM | POA: Diagnosis not present

## 2017-07-20 DIAGNOSIS — H02402 Unspecified ptosis of left eyelid: Secondary | ICD-10-CM | POA: Insufficient documentation

## 2017-07-20 DIAGNOSIS — F329 Major depressive disorder, single episode, unspecified: Secondary | ICD-10-CM | POA: Insufficient documentation

## 2017-07-20 DIAGNOSIS — G43709 Chronic migraine without aura, not intractable, without status migrainosus: Secondary | ICD-10-CM

## 2017-07-20 DIAGNOSIS — K219 Gastro-esophageal reflux disease without esophagitis: Secondary | ICD-10-CM | POA: Diagnosis not present

## 2017-07-20 DIAGNOSIS — E785 Hyperlipidemia, unspecified: Secondary | ICD-10-CM | POA: Diagnosis not present

## 2017-07-20 DIAGNOSIS — R531 Weakness: Secondary | ICD-10-CM | POA: Diagnosis not present

## 2017-07-20 DIAGNOSIS — R200 Anesthesia of skin: Secondary | ICD-10-CM | POA: Insufficient documentation

## 2017-07-20 MED ORDER — VERAPAMIL HCL 80 MG PO TABS: 80.0000 mg | ORAL_TABLET | Freq: Three times a day (TID) | ORAL | 1 refills | Status: DC

## 2017-07-20 NOTE — Patient Instructions (Signed)
Pt not to return to work until follow up appointment 

## 2017-07-20 NOTE — Progress Notes (Signed)
Subjective:    Patient ID: Michele Townsend, female    DOB: 1961-12-02, 56 y.o.   MRN: [id number]  HPI  56 y/o anxiety, depression presents for follow up of post-concussive syndrome.   Initially stated: Located in right temple after a fall at work.  Started 11/07/16.  Denies alleviating factors.  Oxycodone briefly improves the pain.  Bending over exacerbates the pain .She has associated nausea. Constant. Some photophobia.  Denies phonophobia. Dull, sharp, pounding.  Non-radiating. Ibu, tylenol do not seem to help.  Zofran did not help.  Pain inhibits anything she likes.  Denies falls. Of notes pt's husband passed away in 01/15/23.   Last clinic visit 06/09/17.   Since last visit, she notes slight left eye ptosis after Botox injection.  She continues to complain of headaches.  She continues to take lidoderm, Sumatriptan, Propanolol, Elavil 25, Topamax, Zofran. She completed Psychology visits. She has started smoking again, but is trying to stop again.  She is scheduled to see receive a second opinion next month.   Pain Inventory Average Pain 8 Pain Right Now 8 My pain is intermittent, constant, sharp, burning, dull, stabbing, tingling and aching  In the last 24 hours, has pain interfered with the following? General activity 7 Relation with others 7 Enjoyment of life 8 What TIME of day is your pain at its worst? all Sleep (in general) Fair  Pain is worse with: walking, bending, sitting, inactivity, standing and some activites Pain improves with: rest and pacing activities Relief from Meds: 0  Mobility walk without assistance how many minutes can you walk? 30 ability to climb steps?  yes do you drive?  yes Do you have any goals in this area?  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  yes  Neuro/Psych weakness numbness tingling trouble  walking dizziness confusion depression anxiety  Prior Studies Any changes since last visit?  no  Physicians involved in your care Any changes since last visit?  no   Family History  Problem Relation Age of Onset  . Hypertension Mother   . Hypertension Sister   . Breast cancer Maternal Aunt   . Breast cancer Maternal Aunt   of concussion.  Social History   Social History  . Marital status: Married    Spouse name: N/A  . Number of children: N/A  . Years of education: N/A   Social History Main Topics  . Smoking status: Current Every Day Smoker    Packs/day: 0.50    Types: Cigarettes  . Smokeless tobacco: Never Used  . Alcohol use No  . Drug use: No  . Sexual activity: Yes    Birth control/ protection: Surgical   Other Topics Concern  . Not on file   Social History Narrative  . No narrative on file   Past Surgical History:  Procedure Laterality Date  . ABDOMINAL HYSTERECTOMY    . PARATHYROIDECTOMY Right 04/14/2015  . PARATHYROIDECTOMY Right 04/14/2015   Procedure: RIGHT INFERIOR PARATHYROIDECTOMY;  Surgeon: Armandina Gemma, MD;  Location: [facility];  Service: General;  Laterality: Right;  . PARTIAL HYSTERECTOMY    . TUBAL LIGATION     Past Medical History:  Diagnosis Date  . Anxiety   . Current smoker   . Depression   . GERD (gastroesophageal reflux disease)   . HLD (hyperlipidemia)   . IBS (irritable bowel syndrome)   . Migraine   .  Sleep apnea    BP 130/80   Pulse 65   SpO2 99%   Opioid Risk Score:   Fall Risk Score:  `1  Depression screen PHQ 2/9  Depression screen PHQ 2/9 02/18/2017  Decreased Interest 3  Down, Depressed, Hopeless 3  PHQ - 2 Score 6  Altered sleeping 3  Tired, decreased energy 3  Change in appetite 1  Feeling bad or failure about yourself  3  Trouble concentrating 1  Moving slowly or fidgety/restless 1  Suicidal thoughts 1  PHQ-9 Score 19     Review of Systems  Constitutional: Positive for unexpected weight change.  HENT:  Negative.   Eyes: Negative.   Respiratory: Negative.   Cardiovascular: Negative.   Gastrointestinal: Positive for nausea.  Endocrine: Negative.   Genitourinary: Negative.   Musculoskeletal: Positive for gait problem, neck pain and neck stiffness.  Skin: Negative.   Allergic/Immunologic: Negative.   Neurological: Positive for dizziness, weakness, numbness and headaches.       Tingling  Hematological: Negative.   Psychiatric/Behavioral: Positive for confusion and dysphoric mood. The patient is nervous/anxious.   All other systems reviewed and are negative.     Objective:   Physical Exam Gen: NAD. Vital signs reviewed HENT: Normocephalic, Atraumatic Eyes: EOMI. No discharge.  Cardio: RRR. No JVD. Pulm: B/l clear to auscultation.  Effort normal  Abd: Soft, BS+ MSK:  Gait WNL.   No edema.   +TTP right >L periorbital and temporal region Neuro:   Sensation intact to light touch in face  Mild left ptosis  Strength  5/5 in all UE myotomes Skin: Warm and Dry. Intact Psych: Anxious, flat.     Assessment & Plan:  56 y/o anxiety, depression presents for follow up of post-concussive syndrome.   1. Post-concussive syndrome  MRI head reviewed, no acute process.  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   Cont decreased caffeine intake  Cont Lidoderm patches  Cont Sumatriptan PRN  Will decrease Propranolol 20mg  TID to 10mg  for 1 week, then d/c  Will d/c Elavil to 25mg  qhs  Cont Topamax 100 BID  Complete Psychology visits  Injected with Botox on last visit, will repeat ~09/2017  Will trial Verapamil 80 TID for 1 week, may increase to 160 TID after   2. Sleep disturbance  Will d/c Elavil  Cont Melatonin qhs  Will not use Trazodone due to Celexa  3. Nausea  Cont Zofran 8mg  TID PRN  Scopolamine ineffective  4. Tobacco abuse  Encouraged to abstain  5. Reactive depression  Celexa per PCP

## 2017-07-28 ENCOUNTER — Other Ambulatory Visit: Payer: Self-pay | Admitting: Physical Medicine & Rehabilitation

## 2017-08-05 ENCOUNTER — Telehealth: Payer: Self-pay | Admitting: Physical Medicine & Rehabilitation

## 2017-08-05 MED ORDER — ONDANSETRON HCL 8 MG PO TABS: 8.0000 mg | ORAL_TABLET | Freq: Three times a day (TID) | ORAL | 1 refills | Status: DC | PRN

## 2017-08-05 NOTE — Telephone Encounter (Signed)
Patient is needing a refill on Zofran.  Please call patient when this is done.

## 2017-08-11 ENCOUNTER — Ambulatory Visit: Payer: Self-pay | Admitting: Physical Medicine & Rehabilitation

## 2017-08-12 ENCOUNTER — Other Ambulatory Visit: Payer: Self-pay | Admitting: Physical Medicine & Rehabilitation

## 2017-08-17 ENCOUNTER — Encounter
Payer: Worker's Compensation | Attending: Physical Medicine & Rehabilitation | Admitting: Physical Medicine & Rehabilitation

## 2017-08-17 ENCOUNTER — Encounter: Payer: Self-pay | Admitting: Physical Medicine & Rehabilitation

## 2017-08-17 VITALS — BP 125/82 | HR 73

## 2017-08-17 DIAGNOSIS — Z90710 Acquired absence of both cervix and uterus: Secondary | ICD-10-CM | POA: Insufficient documentation

## 2017-08-17 DIAGNOSIS — R110 Nausea: Secondary | ICD-10-CM | POA: Diagnosis not present

## 2017-08-17 DIAGNOSIS — F419 Anxiety disorder, unspecified: Secondary | ICD-10-CM | POA: Diagnosis not present

## 2017-08-17 DIAGNOSIS — G479 Sleep disorder, unspecified: Secondary | ICD-10-CM | POA: Diagnosis not present

## 2017-08-17 DIAGNOSIS — F17210 Nicotine dependence, cigarettes, uncomplicated: Secondary | ICD-10-CM | POA: Insufficient documentation

## 2017-08-17 DIAGNOSIS — F0781 Postconcussional syndrome: Secondary | ICD-10-CM

## 2017-08-17 DIAGNOSIS — K219 Gastro-esophageal reflux disease without esophagitis: Secondary | ICD-10-CM | POA: Insufficient documentation

## 2017-08-17 DIAGNOSIS — F329 Major depressive disorder, single episode, unspecified: Secondary | ICD-10-CM | POA: Diagnosis not present

## 2017-08-17 DIAGNOSIS — Z720 Tobacco use: Secondary | ICD-10-CM | POA: Diagnosis not present

## 2017-08-17 DIAGNOSIS — G894 Chronic pain syndrome: Secondary | ICD-10-CM

## 2017-08-17 DIAGNOSIS — Z9851 Tubal ligation status: Secondary | ICD-10-CM | POA: Insufficient documentation

## 2017-08-17 DIAGNOSIS — I6521 Occlusion and stenosis of right carotid artery: Secondary | ICD-10-CM

## 2017-08-17 DIAGNOSIS — G43709 Chronic migraine without aura, not intractable, without status migrainosus: Secondary | ICD-10-CM

## 2017-08-17 DIAGNOSIS — E785 Hyperlipidemia, unspecified: Secondary | ICD-10-CM | POA: Diagnosis not present

## 2017-08-17 DIAGNOSIS — F1520 Other stimulant dependence, uncomplicated: Secondary | ICD-10-CM

## 2017-08-17 MED ORDER — VERAPAMIL HCL 80 MG PO TABS: 160.0000 mg | ORAL_TABLET | Freq: Three times a day (TID) | ORAL | 1 refills | Status: DC

## 2017-08-17 MED ORDER — PROMETHAZINE HCL 12.5 MG PO TABS: 12.5000 mg | ORAL_TABLET | Freq: Three times a day (TID) | ORAL | 1 refills | Status: DC | PRN

## 2017-08-17 NOTE — Progress Notes (Addendum)
Subjective:    Patient ID: Michele Townsend, female    DOB: 11/04/1961, 56 y.o.   MRN: [id number]  HPI  56 y/o anxiety, depression presents for follow up of post-concussive syndrome.   Initially stated: Located in right temple after a fall at work.  Started 11/07/16.  Denies alleviating factors.  Oxycodone briefly improves the pain.  Bending over exacerbates the pain .She has associated nausea. Constant. Some photophobia.  Denies phonophobia. Dull, sharp, pounding.  Non-radiating. Ibu, tylenol do not seem to help.  Zofran did not help.  Pain inhibits anything she likes.  Denies falls. Of notes pt's husband passed away in 01/29/23.   Last clinic visit 08/05/17.   Since last visit, she states that she has not received any benefit from any medication.  She has stopped the propranolol and Elavil.  She is taking Verapamil without benefit.  The Zofran is not helping either she states.  She states she is not getting any sleep.    Pain Inventory Average Pain 9 Pain Right Now 9 My pain is intermittent, constant, sharp, burning, dull, stabbing, tingling and aching  In the last 24 hours, has pain interfered with the following? General activity 7 Relation with others 7 Enjoyment of life 8 What TIME of day is your pain at its worst? all Sleep (in general) Fair  Pain is worse with: walking, bending, sitting, inactivity, standing and some activites Pain improves with: rest and pacing activities Relief from Meds: 0  Mobility walk without assistance how many minutes can you walk? 30 ability to climb steps?  yes do you drive?  yes Do you have any goals in this area?  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  yes  Neuro/Psych weakness numbness tingling trouble walking dizziness confusion depression anxiety  Prior Studies Any changes since last visit?  no  Physicians  involved in your care Any changes since last visit?  no   Family History  Problem Relation Age of Onset  . Hypertension Mother   . Hypertension Sister   . Breast cancer Maternal Aunt   . Breast cancer Maternal Aunt   of concussion.  Social History   Social History  . Marital status: Married    Spouse name: N/A  . Number of children: N/A  . Years of education: N/A   Social History Main Topics  . Smoking status: Current Every Day Smoker    Packs/day: 0.50    Types: Cigarettes  . Smokeless tobacco: Never Used  . Alcohol use No  . Drug use: No  . Sexual activity: Yes    Birth control/ protection: Surgical   Other Topics Concern  . None   Social History Narrative  . None   Past Surgical History:  Procedure Laterality Date  . ABDOMINAL HYSTERECTOMY    . PARATHYROIDECTOMY Right 04/14/2015  . PARATHYROIDECTOMY Right 04/14/2015   Procedure: RIGHT INFERIOR PARATHYROIDECTOMY;  Surgeon: Armandina Gemma, MD;  Location: [facility];  Service: General;  Laterality: Right;  . PARTIAL HYSTERECTOMY    . TUBAL LIGATION     Past Medical History:  Diagnosis Date  . Anxiety   . Current smoker   . Depression   . GERD (gastroesophageal reflux disease)   . HLD (hyperlipidemia)   . IBS (irritable bowel syndrome)   . Migraine   . Sleep apnea    BP 125/82   Pulse 73  SpO2 98%   Opioid Risk Score:   Fall Risk Score:  `1  Depression screen PHQ 2/9  Depression screen PHQ 2/9 02/18/2017  Decreased Interest 3  Down, Depressed, Hopeless 3  PHQ - 2 Score 6  Altered sleeping 3  Tired, decreased energy 3  Change in appetite 1  Feeling bad or failure about yourself  3  Trouble concentrating 1  Moving slowly or fidgety/restless 1  Suicidal thoughts 1  PHQ-9 Score 19     Review of Systems  Constitutional: Positive for unexpected weight change.  HENT: Negative.   Eyes: Negative.   Respiratory: Negative.   Cardiovascular: Negative.   Gastrointestinal: Positive for diarrhea, nausea and  vomiting.  Endocrine: Negative.   Genitourinary: Negative.   Musculoskeletal: Positive for gait problem, neck pain and neck stiffness.  Skin: Negative.   Allergic/Immunologic: Negative.   Neurological: Positive for dizziness, weakness, numbness and headaches.       Tingling  Hematological: Negative.   Psychiatric/Behavioral: Positive for confusion and dysphoric mood. The patient is nervous/anxious.   All other systems reviewed and are negative.     Objective:   Physical Exam Gen: NAD. Vital signs reviewed HENT: Normocephalic, Atraumatic Eyes: EOMI. No discharge.  Cardio: RRR. No JVD. Pulm: B/l clear to auscultation.  Effort normal Abd: Soft, BS+ MSK:  Gait WNL.   No edema.   +TTP right >L periorbital and temporal region Neuro:   Sensation intact to light touch in face  Mild left ptosis  Strength  5/5 in all UE myotomes Skin: Warm and Dry. Intact Psych: Anxious, flat.     Assessment & Plan:  56 y/o anxiety, depression presents for follow up of post-concussive syndrome.   1. Post-concussive syndrome  MRI head reviewed, no acute process.  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   Cont decreased caffeine intake  Cont Lidoderm patches  Cont Sumatriptan PRN  D/ced Propranolol due to lack of efficacy  D/ced Elavil due to lack of efficacy  Wean Topamax 100 BID  Complete Psychology visits  Injected with Botox on last visit, will repeat ~09/2017  Will increase Verapamil to 160 TID  Pt to have second opinion next week  Encouraged appropriate hydration - 64 oz, pt currently getting less than 12oz of water  Will consider Ophtho referral for visual disturbances  Will consider second opinion from Vascular as well   2. Sleep disturbance  D/ced Elavil due to lack of benefit and sedation  Cont Melatonin qhs  Will not use Trazodone due to Celexa  3. Nausea  Zofran ineffective after some time, will d/c  Scopolamine ineffective  Will order Phergan   4. Tobacco  abuse  Encouraged to abstain  5. Reactive depression  Celexa per PCP

## 2017-08-17 NOTE — Patient Instructions (Addendum)
Wean Topamax 50mg  twice a day for 1 week, then 25mg  twice a day for 1 week, then 25mg  daily for a week, then discontinue   Patient not to return to work until next appointment

## 2017-08-25 ENCOUNTER — Other Ambulatory Visit: Payer: Self-pay

## 2017-08-25 MED ORDER — MELATONIN 1 MG PO TABS: 0.5000 mg | ORAL_TABLET | Freq: Every day | ORAL | 2 refills | Status: DC

## 2017-08-25 MED ORDER — SUMATRIPTAN SUCCINATE 50 MG PO TABS: ORAL_TABLET | ORAL | 2 refills | Status: DC

## 2017-08-25 NOTE — Telephone Encounter (Signed)
Meds refilled and patient notified

## 2017-08-25 NOTE — Telephone Encounter (Signed)
Patient has called requesting medication refill on medications Melatonin and Imitrex. Patient pharmacy is [facility] off [address].

## 2017-09-14 ENCOUNTER — Encounter: Payer: Worker's Compensation | Admitting: Physical Medicine & Rehabilitation

## 2017-09-21 ENCOUNTER — Encounter: Payer: Worker's Compensation | Admitting: Physical Medicine & Rehabilitation

## 2017-09-22 ENCOUNTER — Encounter
Payer: Worker's Compensation | Attending: Physical Medicine & Rehabilitation | Admitting: Physical Medicine & Rehabilitation

## 2017-09-22 ENCOUNTER — Encounter: Payer: Self-pay | Admitting: Physical Medicine & Rehabilitation

## 2017-09-22 VITALS — BP 103/74 | HR 90

## 2017-09-22 DIAGNOSIS — F17210 Nicotine dependence, cigarettes, uncomplicated: Secondary | ICD-10-CM | POA: Diagnosis not present

## 2017-09-22 DIAGNOSIS — F0781 Postconcussional syndrome: Secondary | ICD-10-CM | POA: Diagnosis present

## 2017-09-22 DIAGNOSIS — K589 Irritable bowel syndrome without diarrhea: Secondary | ICD-10-CM | POA: Insufficient documentation

## 2017-09-22 DIAGNOSIS — E890 Postprocedural hypothyroidism: Secondary | ICD-10-CM | POA: Diagnosis not present

## 2017-09-22 DIAGNOSIS — K219 Gastro-esophageal reflux disease without esophagitis: Secondary | ICD-10-CM | POA: Diagnosis not present

## 2017-09-22 DIAGNOSIS — F329 Major depressive disorder, single episode, unspecified: Secondary | ICD-10-CM | POA: Diagnosis not present

## 2017-09-22 DIAGNOSIS — Z90711 Acquired absence of uterus with remaining cervical stump: Secondary | ICD-10-CM | POA: Diagnosis not present

## 2017-09-22 DIAGNOSIS — R110 Nausea: Secondary | ICD-10-CM | POA: Diagnosis not present

## 2017-09-22 DIAGNOSIS — G43909 Migraine, unspecified, not intractable, without status migrainosus: Secondary | ICD-10-CM | POA: Diagnosis not present

## 2017-09-22 DIAGNOSIS — E785 Hyperlipidemia, unspecified: Secondary | ICD-10-CM | POA: Diagnosis not present

## 2017-09-22 DIAGNOSIS — F419 Anxiety disorder, unspecified: Secondary | ICD-10-CM | POA: Diagnosis not present

## 2017-09-22 DIAGNOSIS — I6521 Occlusion and stenosis of right carotid artery: Secondary | ICD-10-CM | POA: Insufficient documentation

## 2017-09-22 DIAGNOSIS — G4730 Sleep apnea, unspecified: Secondary | ICD-10-CM | POA: Diagnosis not present

## 2017-09-22 DIAGNOSIS — G43709 Chronic migraine without aura, not intractable, without status migrainosus: Secondary | ICD-10-CM

## 2017-09-22 NOTE — Progress Notes (Deleted)
Subjective:    Patient ID: Michele Townsend, female    DOB: 04-18-1961, 56 y.o.   MRN: [id number]  HPI  56 y/o anxiety, depression presents for follow up of post-concussive syndrome.   Initially stated: Located in right temple after a fall at work.  Started 11/07/16.  Denies alleviating factors.  Oxycodone briefly improves the pain.  Bending over exacerbates the pain .She has associated nausea. Constant. Some photophobia.  Denies phonophobia. Dull, sharp, pounding.  Non-radiating. Ibu, tylenol do not seem to help.  Zofran did not help.  Pain inhibits anything she likes.  Denies falls. Of notes pt's husband passed away in 2023/01/28.   Last clinic visit 08/05/17.   Since last visit, she states that she has not received any benefit from any medication.  She has stopped the propranolol and Elavil.  She is taking Verapamil without benefit.  The Zofran is not helping either she states.  She states she is not getting any sleep.    Pain Inventory Average Pain 8 Pain Right Now 8 My pain is intermittent, constant, sharp, burning, dull, stabbing, tingling and aching  In the last 24 hours, has pain interfered with the following? General activity 7 Relation with others 7 Enjoyment of life 8 What TIME of day is your pain at its worst? all Sleep (in general) Fair  Pain is worse with: walking, bending, sitting, inactivity, standing and some activites Pain improves with: rest and pacing activities Relief from Meds: 0  Mobility walk without assistance how many minutes can you walk? 30 ability to climb steps?  yes do you drive?  yes Do you have any goals in this area?  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  yes  Neuro/Psych weakness numbness tingling trouble walking dizziness confusion depression anxiety  Prior Studies Any changes since last visit?  no  Physicians  involved in your care Any changes since last visit?  no   Family History  Problem Relation Age of Onset  . Hypertension Mother   . Hypertension Sister   . Breast cancer Maternal Aunt   . Breast cancer Maternal Aunt   of concussion.  Social History   Social History  . Marital status: Married    Spouse name: N/A  . Number of children: N/A  . Years of education: N/A   Social History Main Topics  . Smoking status: Current Every Day Smoker    Packs/day: 0.50    Types: Cigarettes  . Smokeless tobacco: Never Used  . Alcohol use No  . Drug use: No  . Sexual activity: Yes    Birth control/ protection: Surgical   Other Topics Concern  . None   Social History Narrative  . None   Past Surgical History:  Procedure Laterality Date  . ABDOMINAL HYSTERECTOMY    . PARATHYROIDECTOMY Right 04/14/2015  . PARATHYROIDECTOMY Right 04/14/2015   Procedure: RIGHT INFERIOR PARATHYROIDECTOMY;  Surgeon: Armandina Gemma, MD;  Location: [facility];  Service: General;  Laterality: Right;  . PARTIAL HYSTERECTOMY    . TUBAL LIGATION     Past Medical History:  Diagnosis Date  . Anxiety   . Current smoker   . Depression   . GERD (gastroesophageal reflux disease)   . HLD (hyperlipidemia)   . IBS (irritable bowel syndrome)   . Migraine   . Sleep apnea    BP 111/69   Pulse 90  SpO2 97%   Opioid Risk Score:   Fall Risk Score:  `1  Depression screen PHQ 2/9  Depression screen PHQ 2/9 02/18/2017  Decreased Interest 3  Down, Depressed, Hopeless 3  PHQ - 2 Score 6  Altered sleeping 3  Tired, decreased energy 3  Change in appetite 1  Feeling bad or failure about yourself  3  Trouble concentrating 1  Moving slowly or fidgety/restless 1  Suicidal thoughts 1  PHQ-9 Score 19     Review of Systems  Constitutional: Positive for unexpected weight change.  HENT: Negative.   Eyes: Negative.   Respiratory: Negative.   Cardiovascular: Negative.   Gastrointestinal: Positive for constipation,  diarrhea, nausea and vomiting.  Endocrine: Negative.   Genitourinary: Negative.   Musculoskeletal: Positive for gait problem, neck pain and neck stiffness.  Skin: Negative.   Allergic/Immunologic: Negative.   Neurological: Positive for dizziness, weakness, numbness and headaches.       Tingling  Hematological: Negative.   Psychiatric/Behavioral: Positive for confusion and dysphoric mood. The patient is nervous/anxious.   All other systems reviewed and are negative.     Objective:   Physical Exam Gen: NAD. Vital signs reviewed HENT: Normocephalic, Atraumatic Eyes: EOMI. No discharge.  Cardio: RRR. No JVD. Pulm: B/l clear to auscultation.  Effort normal Abd: Soft, BS+ MSK:  Gait WNL.   No edema.   +TTP right >L periorbital and temporal region Neuro:   Sensation intact to light touch in face  Mild left ptosis  Strength  5/5 in all UE myotomes Skin: Warm and Dry. Intact Psych: Anxious, flat.     Assessment & Plan:  56 y/o anxiety, depression presents for follow up of post-concussive syndrome.   1. Post-concussive syndrome  MRI head reviewed, no acute process.  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   Cont decreased caffeine intake  Cont Lidoderm patches  Cont Sumatriptan PRN  D/ced Propranolol due to lack of efficacy  D/ced Elavil due to lack of efficacy  Wean Topamax 100 BID  Complete Psychology visits  Injected with Botox on last visit, will repeat ~09/2017  Will increase Verapamil to 160 TID  Pt to have second opinion next week  Encouraged appropriate hydration - 64 oz, pt currently getting less than 12oz of water  Will consider Ophtho referral for visual disturbances  Will consider second opinion from Vascular as well   2. Sleep disturbance  D/ced Elavil due to lack of benefit and sedation  Cont Melatonin qhs  Will not use Trazodone due to Celexa  3. Nausea  Zofran ineffective after some time, will d/c  Scopolamine ineffective  Will order  Phergan   4. Tobacco abuse  Encouraged to abstain  5. Reactive depression  Celexa per PCP

## 2017-09-22 NOTE — Patient Instructions (Signed)
Patient not to return to work until follow up appointment

## 2017-09-22 NOTE — Addendum Note (Signed)
Addended by: Delice Lesch A on: 09/22/2017 10:07 AM   Modules accepted: Orders

## 2017-09-22 NOTE — Progress Notes (Signed)
Botox Injection for chronic migraine headaches ICD 10: G43.709  Dilution: 100 Units/ 88ml preservative free NS x2 Indication: refractory headaches (At least 15 days per month/headache lasting greater than 4 hours per day) incompletely responsive to other more conservative measures.  Informed consent was obtained after describing risks and benefits of the procedure with the patient. This includes bleeding, bruising, infection, excessive weakness, or medication side effects. A REMS form is on file and signed. Needle: 30g 1/2 inch needle  HPI: 56 y/o anxiety, depression presents for follow up of post-concussive syndrome with transformed migraines.  Number of units per muscle:  Right temporalis 40 units, 4 access points Left temporalis 20 units,  4 access points Right frontalis 10 units, 2 access points Left frontalis 10 units, 2 access points Procerus 5 units, 1 access point Right corrugator 5 units, 1 access point Left corrugator 5 units, 1 access point Right occipitalis 15 units, 3 access points Left occipitalis 15 units, 3 access points Right cervical paraspinal 10 units, 2 access points Left cervical paraspinals 10 units, 2 access points  Right trapezius 15 units, 3 access points Left trapezius 15 units, 3 access points   Remaining 25 units was discarded due to unavoidable waste.   Post procedure instructions were given. A followup appointment was made.  Plan: RTC 6 weeks Referral to Neuro Ophtho Wean Verapamil

## 2017-10-20 ENCOUNTER — Telehealth: Payer: Self-pay | Admitting: Physical Medicine & Rehabilitation

## 2017-10-20 NOTE — Telephone Encounter (Signed)
Dr Posey Pronto responded to [employer] [facility] - I mailed to attorney sent copy to scan center.

## 2017-10-28 ENCOUNTER — Encounter: Payer: Worker's Compensation | Admitting: Physical Medicine & Rehabilitation

## 2018-05-27 IMAGING — CR DG KNEE COMPLETE 4+V*R*
4 series · 4 of 4 positions shown · non-contrast
Comparison: 02/11/2015

CLINICAL DATA: Injury post fall, back pain, knee pain

EXAM:
RIGHT KNEE - COMPLETE 4+ VIEW

[x knee ap right (1 of 3)]
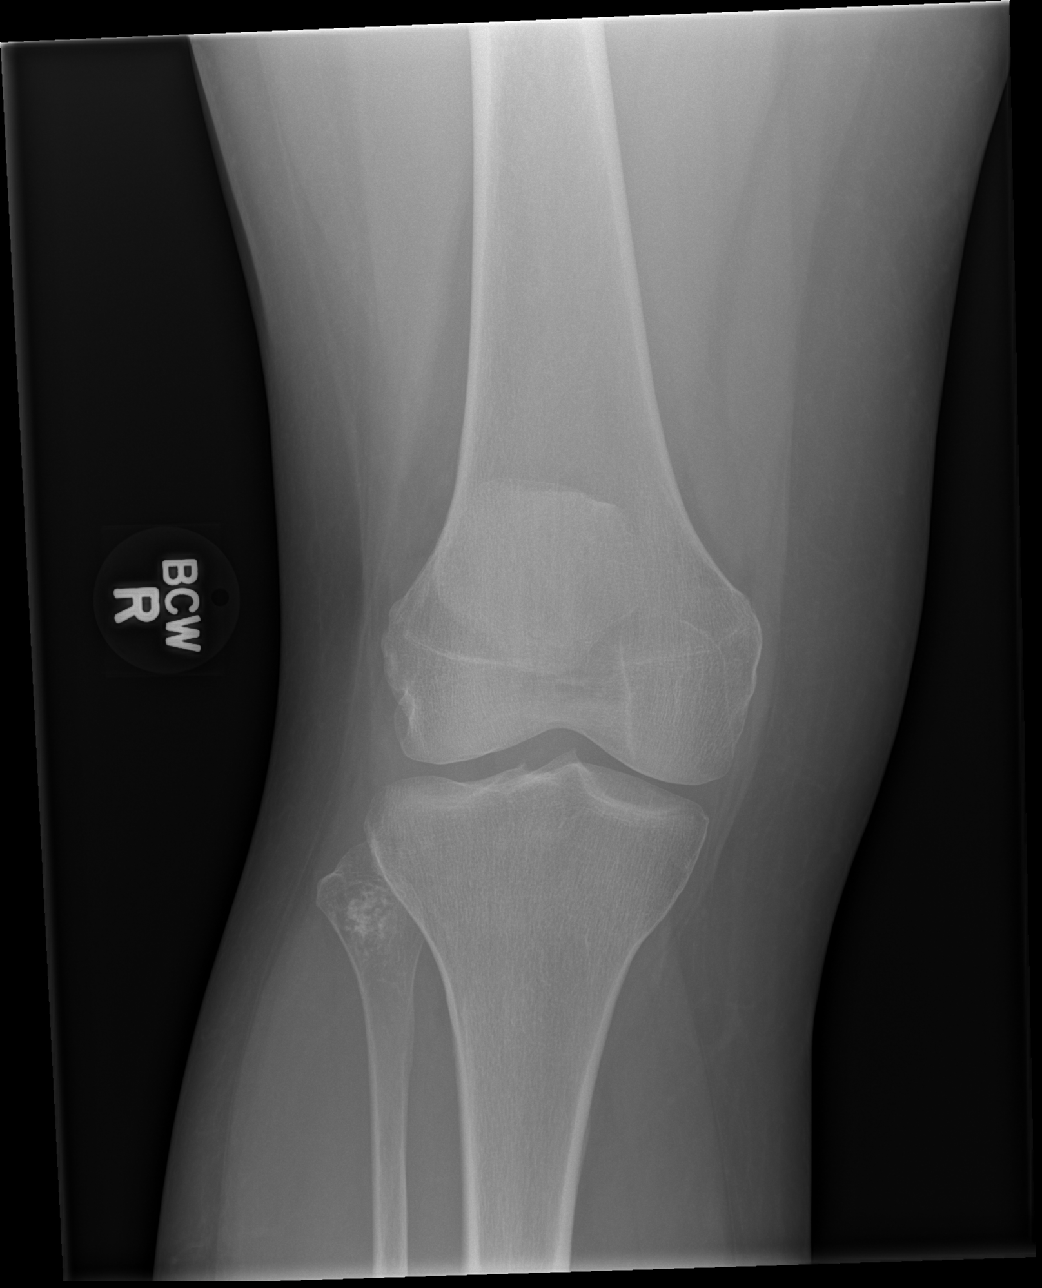

[x knee ap right (2 of 3)]
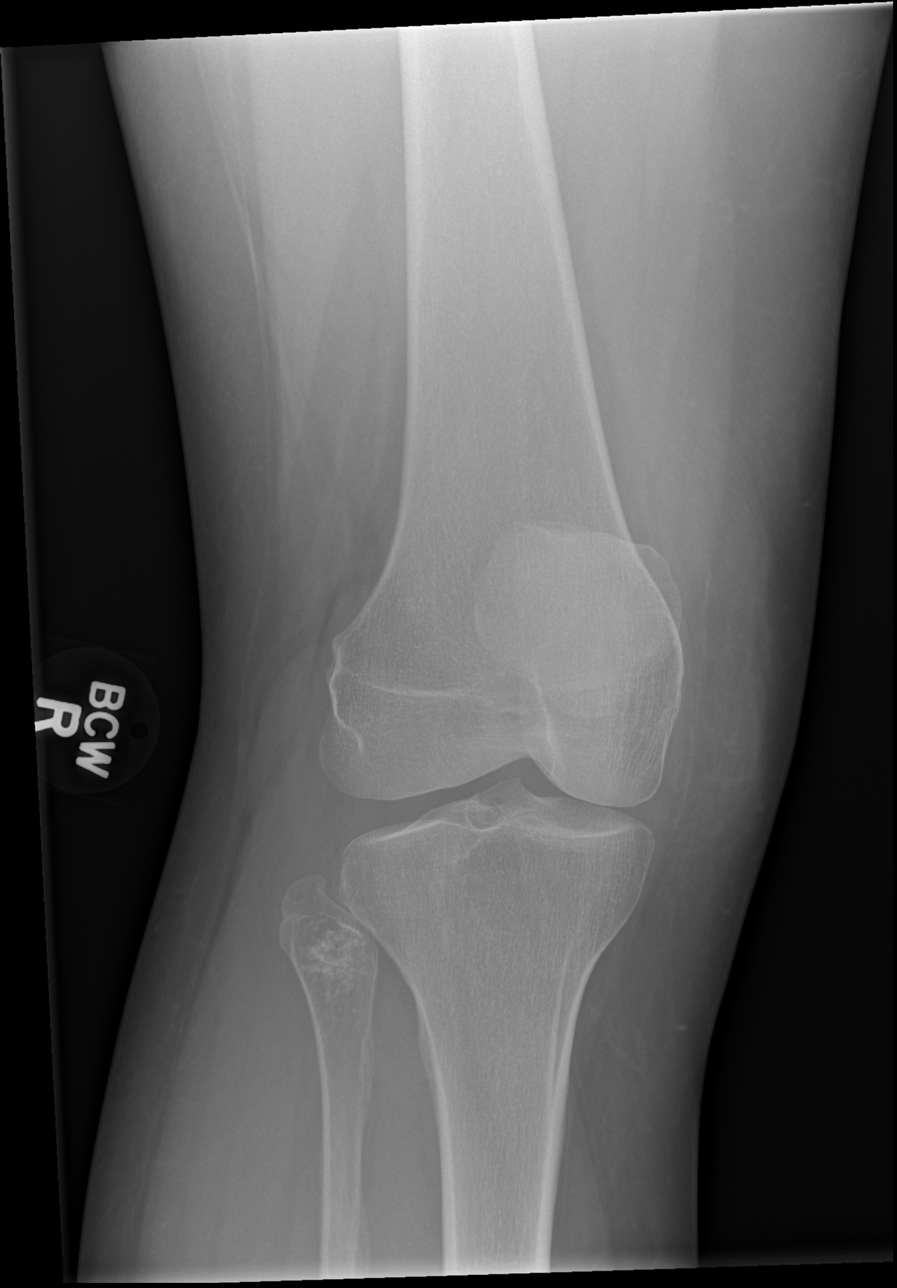

[x knee ap right (3 of 3)]
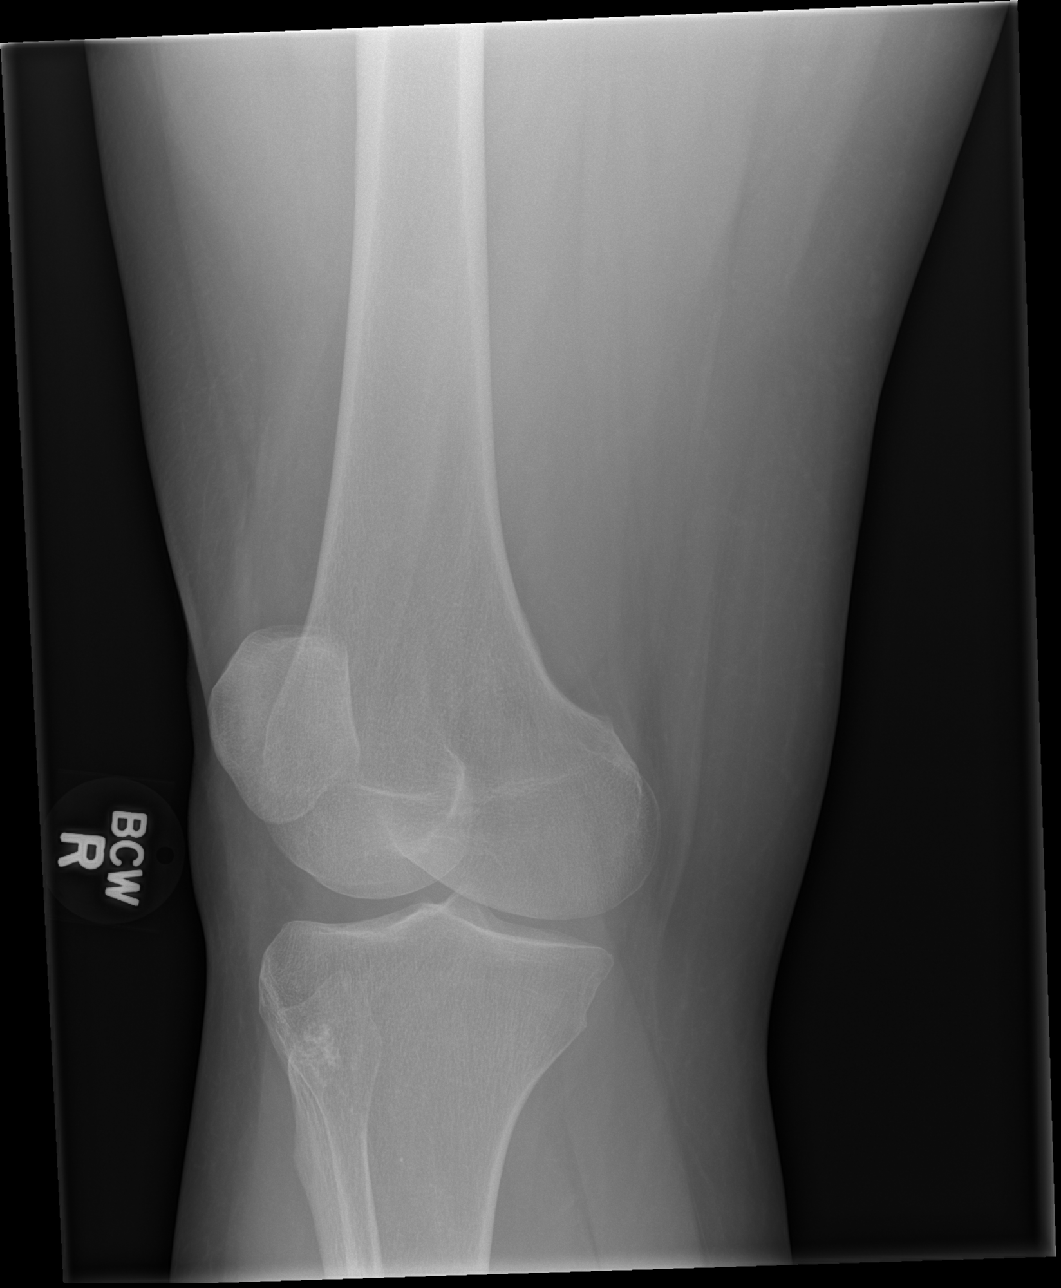

[x knee lat right]
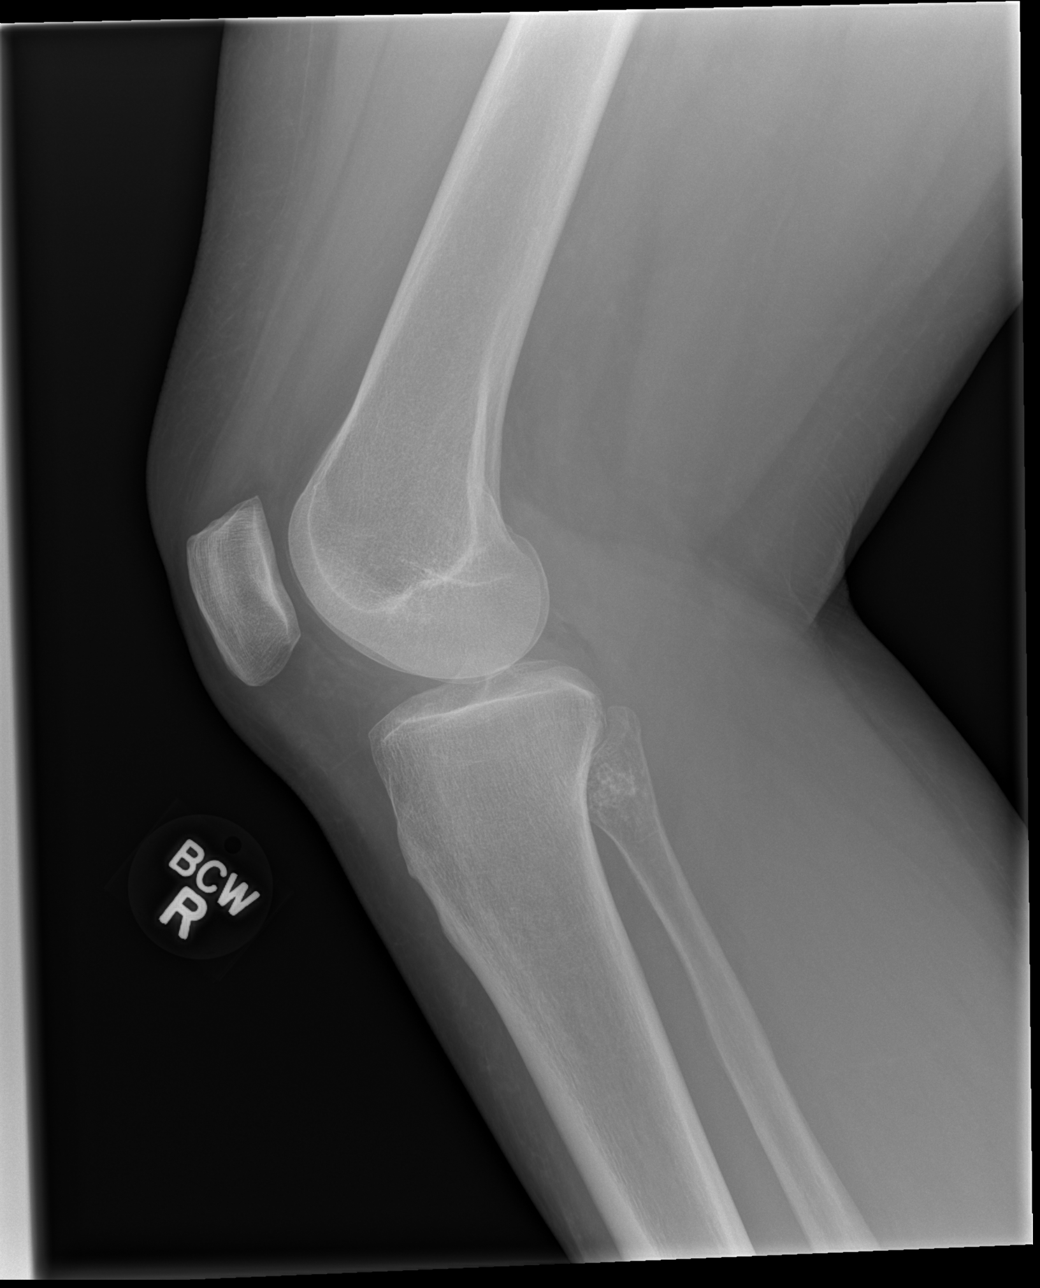

[4 of 4 positions shown; findings below may reference images not displayed]

FINDINGS: Four views of the right knee submitted. No acute fracture or
subluxation. Mild narrowing of medial joint compartment. No joint
effusion. Again noted some focal sclerosis in fibular head
suspicious for osteochondroma or bone infarct. Further correlation
with MRI could be performed as clinically warranted.
IMPRESSION: No acute fracture or subluxation. Mild narrowing of medial joint
compartment. No joint effusion. Again noted some focal sclerosis in
fibular head suspicious for osteochondroma or bone infarct. Further
correlation with MRI could be performed as clinically warranted.

## 2018-06-29 IMAGING — CT CT ANGIO HEAD
1 of 5 series · 4 of 37 positions shown · IV contrast (iopamidol)
Comparison: MRI 12/01/2016

CLINICAL DATA: Abnormal MRI of the head with abnormal flow in the
right internal carotid artery. Fell at work striking the right side
of the head with right-sided pain.

EXAM:
CT ANGIOGRAPHY HEAD AND NECK
TECHNIQUE: Multidetector CT imaging of the head and neck was performed using
the standard protocol during bolus administration of intravenous
contrast. Multiplanar CT image reconstructions and MIPs were
obtained to evaluate the vascular anatomy. Carotid stenosis
measurements (when applicable) are obtained utilizing NASCET
criteria, using the distal internal carotid diameter as the
denominator.
CONTRAST:  80mL PO1S0G-SYY IOPAMIDOL (PO1S0G-SYY) INJECTION 61%

[Series 5: head/neck angio · axial · 0.54mm/px · z∈[-174,+48]mm · 4 of 124 slices shown]
[im 25/124  brain]
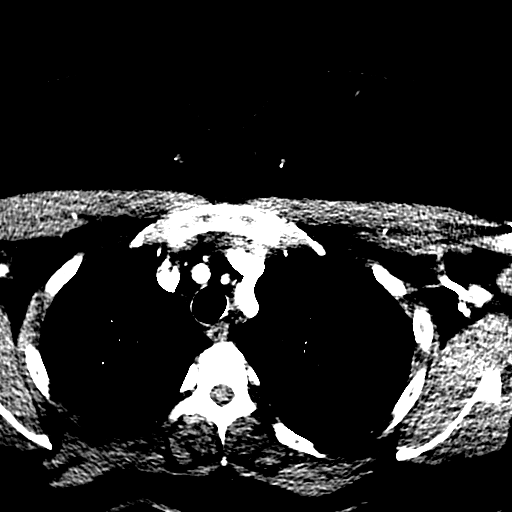
[im 50/124  bone]
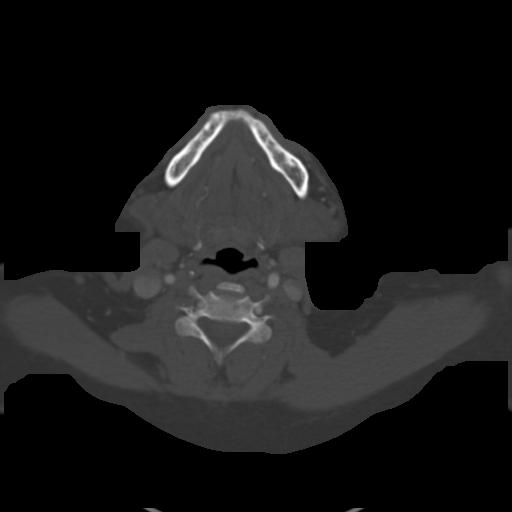
[im 74/124  brain]
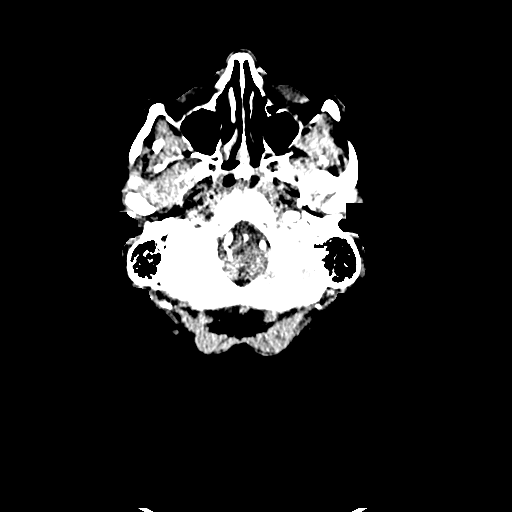
[im 99/124  bone]
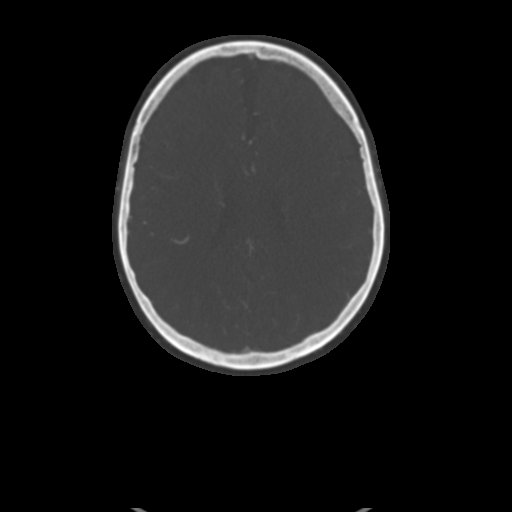

[4 of 37 positions shown; findings below may reference images not displayed]

FINDINGS: CT HEAD FINDINGS

Brain: No sign of acute infarction. Small low densities in the basal
gangliar region on CT were felt secondary to perivascular spaces. I
think these could be small perivascular spaces or microvascular
insults. No mass lesion, hemorrhage, hydrocephalus or extra-axial
collection. Patient does have mild frontal atrophy for age.

Vascular: No vascular calcification

Skull: No skull fracture

Sinuses: Normal

Orbits: Normal

Review of the MIP images confirms the above findings

CTA NECK FINDINGS

Aortic arch: Normal

Right carotid system: Common carotid artery is slightly small but
widely patent to the bifurcation. The external carotid artery is
patent. The internal carotid artery is occluded at the origin. No
reconstituted flow.

Left carotid system: Common carotid artery widely patent to the
bifurcation. The carotid bifurcation is normal without
atherosclerotic plaque or stenosis. Internal carotid artery is
tortuous but widely patent.

Vertebral arteries: Both vertebral artery origins are widely patent.
Both vertebral arteries are widely patent through the cervical
region to the foramen magnum.

Skeleton: Chronic fusion C5-6. Chronic disc space narrowing C6-7. No
acute bone finding.

Other neck: No soft tissue lesion.

Upper chest: Normal

Review of the MIP images confirms the above findings

CTA HEAD FINDINGS

Anterior circulation: Left internal carotid artery is widely patent
through the siphon. As noted above, the right internal carotid
artery is occluded and does not show reconstituted flow in the
proximal siphon. There is reconstitution within the midportion of
the siphon probably from external to internal collaterals. Flow is
present in the anterior and middle cerebral vessels, secondary to
patent posterior communicating artery, external to internal
collaterals, and patent anterior communicating artery.

Posterior circulation: Both internal carotid arteries are widely
patent through the foramen magnum to the basilar. No basilar
stenosis. Posterior circulation branch vessels are normal.

Venous sinuses: Patent and normal

Anatomic variants: None significant

Review of the MIP images confirms the above findings
IMPRESSION: Occlusion of the right internal carotid artery at the origin of the
vessel. Specific etiology and age are not determined. My presumption
is that this is chronic. There is not any calcified atherosclerosis.
Left carotid bifurcation is normal.

No intracranial end vessel occlusion demonstrated. Flow to the right
anterior circulation occurs via patent anterior and posterior
communicating arteries and external to internal collaterals which
reconstitute some flow in the right carotid siphon.

## 2018-07-20 ENCOUNTER — Encounter: Payer: Self-pay | Attending: Physical Medicine & Rehabilitation | Admitting: Physical Medicine & Rehabilitation

## 2018-07-20 ENCOUNTER — Encounter: Payer: Self-pay | Admitting: Physical Medicine & Rehabilitation

## 2018-07-20 VITALS — BP 107/75 | HR 68 | Ht 64.0 in | Wt 201.0 lb

## 2018-07-20 DIAGNOSIS — G479 Sleep disorder, unspecified: Secondary | ICD-10-CM

## 2018-07-20 DIAGNOSIS — F329 Major depressive disorder, single episode, unspecified: Secondary | ICD-10-CM

## 2018-07-20 DIAGNOSIS — E785 Hyperlipidemia, unspecified: Secondary | ICD-10-CM | POA: Insufficient documentation

## 2018-07-20 DIAGNOSIS — Z720 Tobacco use: Secondary | ICD-10-CM

## 2018-07-20 DIAGNOSIS — K589 Irritable bowel syndrome without diarrhea: Secondary | ICD-10-CM | POA: Insufficient documentation

## 2018-07-20 DIAGNOSIS — G894 Chronic pain syndrome: Secondary | ICD-10-CM

## 2018-07-20 DIAGNOSIS — G4730 Sleep apnea, unspecified: Secondary | ICD-10-CM | POA: Insufficient documentation

## 2018-07-20 DIAGNOSIS — F17210 Nicotine dependence, cigarettes, uncomplicated: Secondary | ICD-10-CM | POA: Insufficient documentation

## 2018-07-20 DIAGNOSIS — K219 Gastro-esophageal reflux disease without esophagitis: Secondary | ICD-10-CM | POA: Insufficient documentation

## 2018-07-20 DIAGNOSIS — R42 Dizziness and giddiness: Secondary | ICD-10-CM | POA: Insufficient documentation

## 2018-07-20 DIAGNOSIS — Z8249 Family history of ischemic heart disease and other diseases of the circulatory system: Secondary | ICD-10-CM | POA: Insufficient documentation

## 2018-07-20 DIAGNOSIS — F419 Anxiety disorder, unspecified: Secondary | ICD-10-CM | POA: Insufficient documentation

## 2018-07-20 DIAGNOSIS — R531 Weakness: Secondary | ICD-10-CM | POA: Insufficient documentation

## 2018-07-20 DIAGNOSIS — R110 Nausea: Secondary | ICD-10-CM

## 2018-07-20 DIAGNOSIS — F0781 Postconcussional syndrome: Secondary | ICD-10-CM

## 2018-07-20 DIAGNOSIS — H539 Unspecified visual disturbance: Secondary | ICD-10-CM

## 2018-07-20 DIAGNOSIS — R200 Anesthesia of skin: Secondary | ICD-10-CM | POA: Insufficient documentation

## 2018-07-20 MED ORDER — TOPIRAMATE 100 MG PO TABS: 200.0000 mg | ORAL_TABLET | Freq: Two times a day (BID) | ORAL | 2 refills | Status: DC

## 2018-07-20 NOTE — Progress Notes (Signed)
Subjective:    Patient ID: Michele Townsend, female    DOB: 28-Aug-1961, 57 y.o.   MRN: [id number]  HPI  57 y/o anxiety, depression presents for follow up of post-concussive syndrome.   Initially stated: Located in right temple after a fall at work.  Started 11/07/16.  Denies alleviating factors.  Oxycodone briefly improves the pain.  Bending over exacerbates the pain .She has associated nausea. Constant. Some photophobia.  Denies phonophobia. Dull, sharp, pounding.  Non-radiating. Ibu, tylenol do not seem to help.  Zofran did not help.  Pain inhibits anything she likes.  Denies falls. Of notes pt's husband passed away in February 06, 2023.   Last clinic visit 09/22/17.   Patient had Botox injection at that time.  She states her workman's comp case was settled and is having financial constraints.  She did not have benefit with Botox injections.  She continues to take Topomax.  She had a second opinion in [place] and was informed that she did not have any deficits. Phenergan helps to a certain extent with nausea. She started smoking <1 PPD. She is currently in the process for filing for disability.   Pain Inventory Average Pain 7 Pain Right Now 7 My pain is intermittent, sharp, burning, dull, stabbing, tingling and aching  In the last 24 hours, has pain interfered with the following? General activity 8 Relation with others 6 Enjoyment of life 7 What TIME of day is your pain at its worst? all Sleep (in general) Poor  Pain is worse with: walking, bending, sitting, inactivity and standing Pain improves with: na Relief from Meds: 0  Mobility walk without assistance how many minutes can you walk? 30 ability to climb steps?  yes do you drive?  yes Do you have any goals in this area?  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  yes  Neuro/Psych weakness numbness tingling trouble  walking dizziness confusion depression anxiety  Prior Studies Any changes since last visit?  no  Physicians involved in your care Any changes since last visit?  no   Family History  Problem Relation Age of Onset  . Hypertension Mother   . Hypertension Sister   . Breast cancer Maternal Aunt   . Breast cancer Maternal Aunt   of concussion.  Social History   Socioeconomic History  . Marital status: Married    Spouse name: Not on file  . Number of children: Not on file  . Years of education: Not on file  . Highest education level: Not on file  Occupational History  . Not on file  Social Needs  . Financial resource strain: Not on file  . Food insecurity:    Worry: Not on file    Inability: Not on file  . Transportation needs:    Medical: Not on file    Non-medical: Not on file  Tobacco Use  . Smoking status: Current Every Day Smoker    Packs/day: 0.50    Types: Cigarettes  . Smokeless tobacco: Never Used  Substance and Sexual Activity  . Alcohol use: No  . Drug use: No  . Sexual activity: Yes    Birth control/protection: Surgical  Lifestyle  . Physical activity:    Days per week: Not on file    Minutes per session: Not on file  . Stress: Not on file  Relationships  . Social connections:    Talks  on phone: Not on file    Gets together: Not on file    Attends religious service: Not on file    Active member of club or organization: Not on file    Attends meetings of clubs or organizations: Not on file    Relationship status: Not on file  Other Topics Concern  . Not on file  Social History Narrative  . Not on file   Past Surgical History:  Procedure Laterality Date  . ABDOMINAL HYSTERECTOMY    . PARATHYROIDECTOMY Right 04/14/2015  . PARATHYROIDECTOMY Right 04/14/2015   Procedure: RIGHT INFERIOR PARATHYROIDECTOMY;  Surgeon: Armandina Gemma, MD;  Location: [facility];  Service: General;  Laterality: Right;  . PARTIAL HYSTERECTOMY    . TUBAL LIGATION     Past  Medical History:  Diagnosis Date  . Anxiety   . Current smoker   . Depression   . GERD (gastroesophageal reflux disease)   . HLD (hyperlipidemia)   . IBS (irritable bowel syndrome)   . Migraine   . Sleep apnea    BP 107/75   Pulse 68   Ht 5\' 4"  (1.626 m)   Wt 201 lb (91.2 kg)   SpO2 98%   BMI 34.50 kg/m   Opioid Risk Score:   Fall Risk Score:  `1  Depression screen PHQ 2/9  Depression screen PHQ 2/9 02/18/2017  Decreased Interest 3  Down, Depressed, Hopeless 3  PHQ - 2 Score 6  Altered sleeping 3  Tired, decreased energy 3  Change in appetite 1  Feeling bad or failure about yourself  3  Trouble concentrating 1  Moving slowly or fidgety/restless 1  Suicidal thoughts 1  PHQ-9 Score 19     Review of Systems  Constitutional: Positive for unexpected weight change.  HENT: Negative.   Eyes: Negative.   Respiratory: Negative.   Cardiovascular: Negative.   Gastrointestinal: Negative.   Endocrine: Negative.   Genitourinary: Negative.   Musculoskeletal: Positive for neck pain and neck stiffness.  Skin: Negative.   Allergic/Immunologic: Negative.   Neurological: Positive for dizziness, weakness, numbness and headaches.       Tingling  Hematological: Negative.   Psychiatric/Behavioral: Positive for dysphoric mood. The patient is nervous/anxious.   All other systems reviewed and are negative.     Objective:   Physical Exam Gen: NAD. Vital signs reviewed HENT: Normocephalic, Atraumatic Eyes: EOMI. No discharge.  Cardio: RRR. No JVD. Pulm: B/l clear to auscultation.  Effort normal Abd: Soft, BS+ MSK:  Gait WNL.   No edema.   +TTP right >L temporal region Neuro:   Mild left ptosis  Strength  5/5 in all UE myotomes Skin: Warm and Dry. Intact Psych: Anxious, flat.     Assessment & Plan:  57 y/o anxiety, depression presents for follow up of post-concussive syndrome.   1. Post-concussive syndrome  MRI head reviewed, no acute process.  CTA report stating likely  chronic right ICA occlusion.   Cont decreased caffeine intake  Cont Lidoderm patches prn, helps a little bit  Cont Sumatriptan PRN, helps a little bit  D/ced Propranolol, Elavil due to lack of efficacy  Will increase Topamax to 200 BID  Complete Psychology visits, encouraged follow up  Injected with Botox x2 without benefit  Encouraged appropriate hydration - 64 oz  Will refer to Ophtho for visual disturbances  Will consider second opinion from Vascular as well  Encouraged paperwork for [facility] assistance program  Encouraged consistent use of corrective eyewear   2. Sleep disturbance  D/ced Elavil due to lack of benefit and sedation  Cont Melatonin qhs  Will not use Trazodone due to Celexa  3. Nausea  Zofran ineffective after some time, d/ced  Scopolamine ineffective  Cont Phergan   4. Tobacco abuse  Encouraged to abstain again  5. Reactive depression  Celexa per PCP

## 2018-10-12 ENCOUNTER — Ambulatory Visit: Payer: Self-pay | Admitting: Neurology

## 2018-10-16 ENCOUNTER — Encounter: Payer: Self-pay | Admitting: Neurology

## 2018-10-16 ENCOUNTER — Ambulatory Visit: Payer: Self-pay | Admitting: Neurology

## 2018-10-16 VITALS — BP 126/86 | Ht 64.0 in | Wt 197.8 lb

## 2018-10-16 DIAGNOSIS — G8929 Other chronic pain: Secondary | ICD-10-CM | POA: Insufficient documentation

## 2018-10-16 DIAGNOSIS — R51 Headache: Secondary | ICD-10-CM

## 2018-10-16 MED ORDER — LAMOTRIGINE 100 MG PO TABS: 100.0000 mg | ORAL_TABLET | Freq: Two times a day (BID) | ORAL | 22 refills | Status: DC

## 2018-10-16 MED ORDER — LAMOTRIGINE 25 MG PO TABS: ORAL_TABLET | ORAL | 0 refills | Status: DC

## 2018-10-16 MED ORDER — TIZANIDINE HCL 4 MG PO TABS: 4.0000 mg | ORAL_TABLET | Freq: Four times a day (QID) | ORAL | 6 refills | Status: DC | PRN

## 2018-10-16 NOTE — Patient Instructions (Signed)
Aleve as needed with tizanidine as needed for severe headaches  Heating pad Sleep would help too

## 2018-10-16 NOTE — Progress Notes (Signed)
PATIENT: Michele Townsend DOB: 1961-01-12  Chief Complaint  Patient presents with  . Headache    Reports frequent headaches since suffering a concussion, while at work, in 10/2016.  She hit her head against a metal door.  She is currently taking topiramate 200mg  BID but has not really noticed a decrease in her headaches.  Reports daily headaches that vary in severity.  She takes both gabapentin and naproxen daily but these medications are for her back pain.   Marland Kitchen PCP    Redmon, Barth Kirks, PA-C     HISTORICAL  Michele Townsend is a 57 year old female, seen in request by her primary care PA Redmon, Noelle for evaluation of headache, initial evaluation was on October 16, 2018.  I have reviewed and summarized the referring note from the referring physician.  She had a past medical history of depression, hyperlipidemia, history of hyperparathyroidism, status post parathyroidectomy, obstructive sleep apnea,   She was involved in a work-related injury in December 2017, she fell bumped her right frontal region at the metal frame of the door, she was evaluated at emergency room, we personally reviewed MRI of brain with without contrast on December 01, 2016, no acute abnormality, suggestive of occluded right internal carotid artery, CT angiogram of head and neck confirmed occlusion of right internal carotid artery at the origin of the vessel, that is chronic, there is mild calcified atherosclerotic disease, left carotid bifurcation is normal, flow to the right anterior circulation occurs via patent anterior and posterior communicating arteries, and external to internal collaterals, which reconstituted flow in the right carotid siphon, she was seen by vascular surgeon, not a surgery candidate, was started on lovastatin 20 mg daily  She got settlement through [employer], but she still has persistent pain, tension, radiating pain to bilateral frontal region, woke up felt numbness at the occipital  region, when she moves, also feels severe pain, "cry all day long',  She was previously treated by Dr. Posey Pronto, taking Topamax up to 200 mg twice a day without helping her headaches, previously tried Botox injection in October 2018 much help, she is also on polypharmacy for depression anxiety, 40 mg daily, Buspar 5 mg twice a day gabapentin 100 mg every morning,  REVIEW OF SYSTEMS: Full 14 system review of systems performed and notable only for fatigue, blurry vision, diarrhea, constipation, joint pain, achy muscles, confusion, headaches, dizziness, restless leg, depression, anxiety  All other review of systems were negative.  ALLERGIES: No Known Allergies  HOME MEDICATIONS: Current Outpatient Medications  Medication Sig Dispense Refill  . ALPRAZolam (XANAX) 0.5 MG tablet Take 0.5 mg by mouth 2 (two) times daily as needed. For anxiety    . aspirin EC 81 MG tablet Take by mouth.    . busPIRone (BUSPAR) 5 MG tablet Take 5 mg by mouth 2 (two) times daily.    . citalopram (CELEXA) 40 MG tablet Take 40 mg by mouth daily.    Marland Kitchen gabapentin (NEURONTIN) 100 MG capsule TAKE 1 CAPSULE BY MOUTH ONCE DAILY IN THE MORNING  1  . lovastatin (MEVACOR) 20 MG tablet Take 20 mg by mouth daily.    . Melatonin 1 MG TABS Take 0.5 tablets (0.5 mg total) by mouth at bedtime. (Patient taking differently: Take 5 mg by mouth at bedtime. ) 15 tablet 2  . mometasone-formoterol (DULERA) 100-5 MCG/ACT AERO Inhale 2 puffs into the lungs 2 (two) times daily.    . naproxen (NAPROSYN) 500 MG tablet TAKE 1 TABLET BY  MOUTH TWICE DAILY WITH FOOD AS NEEDED FOR PAIN AND SWELLING  1  . promethazine (PHENERGAN) 12.5 MG tablet Take 1 tablet (12.5 mg total) by mouth every 8 (eight) hours as needed for nausea or vomiting. 90 tablet 1  . topiramate (TOPAMAX) 100 MG tablet Take 2 tablets (200 mg total) by mouth 2 (two) times daily. 120 tablet 2   No current facility-administered medications for this visit.     PAST MEDICAL HISTORY: Past  Medical History:  Diagnosis Date  . Anxiety   . Anxiety   . Chronic low back pain   . Current smoker   . Depression   . GERD (gastroesophageal reflux disease)   . HLD (hyperlipidemia)   . IBS (irritable bowel syndrome)   . Migraine   . Sleep apnea     PAST SURGICAL HISTORY: Past Surgical History:  Procedure Laterality Date  . ABDOMINAL HYSTERECTOMY    . PARATHYROIDECTOMY Right 04/14/2015  . PARATHYROIDECTOMY Right 04/14/2015   Procedure: RIGHT INFERIOR PARATHYROIDECTOMY;  Surgeon: Armandina Gemma, MD;  Location: [facility];  Service: General;  Laterality: Right;  . PARTIAL HYSTERECTOMY    . TUBAL LIGATION      FAMILY HISTORY: Family History  Problem Relation Age of Onset  . Hypertension Mother   . Hypertension Sister   . Breast cancer Maternal Aunt   . Breast cancer Maternal Aunt   . Lung cancer Father     SOCIAL HISTORY: Social History   Socioeconomic History  . Marital status: Married    Spouse name: Not on file  . Number of children: 1  . Years of education: 56  . Highest education level: High school graduate  Occupational History  . Occupation: unemployed - currently seeking disability  Social Needs  . Financial resource strain: Not on file  . Food insecurity:    Worry: Not on file    Inability: Not on file  . Transportation needs:    Medical: Not on file    Non-medical: Not on file  Tobacco Use  . Smoking status: Current Every Day Smoker    Packs/day: 1.00    Types: Cigarettes  . Smokeless tobacco: Never Used  Substance and Sexual Activity  . Alcohol use: No  . Drug use: No  . Sexual activity: Yes    Birth control/protection: Surgical  Lifestyle  . Physical activity:    Days per week: Not on file    Minutes per session: Not on file  . Stress: Not on file  Relationships  . Social connections:    Talks on phone: Not on file    Gets together: Not on file    Attends religious service: Not on file    Active member of club or organization: Not on file     Attends meetings of clubs or organizations: Not on file    Relationship status: Not on file  . Intimate partner violence:    Fear of current or ex partner: Not on file    Emotionally abused: Not on file    Physically abused: Not on file    Forced sexual activity: Not on file  Other Topics Concern  . Not on file  Social History Narrative   Lives at home with her brother-in-law.   Right-handed.   Caffeine use:  5-6 cups per day.     PHYSICAL EXAM   Vitals:   10/16/18 1415  Weight: 197 lb 12 oz (89.7 kg)  Height: 5\' 4"  (1.626 m)    Not recorded  Body mass index is 33.94 kg/m.  PHYSICAL EXAMNIATION: Depressed looking middle-aged female  Gen: NAD, conversant, well nourised, obese, well groomed                     Cardiovascular: Regular rate rhythm, no peripheral edema, warm, nontender. Eyes: Conjunctivae clear without exudates or hemorrhage Neck: Supple, no carotid bruits. Pulmonary: Clear to auscultation bilaterally   NEUROLOGICAL EXAM:  MENTAL STATUS: Speech:    Speech is normal; fluent and spontaneous with normal comprehension.  Cognition:     Orientation to time, place and person     Normal recent and remote memory     Normal Attention span and concentration     Normal Language, naming, repeating,spontaneous speech     Fund of knowledge   CRANIAL NERVES: CN II: Visual Patel are full to confrontation. Fundoscopic exam is normal with sharp discs and no vascular changes. Pupils are round equal and briskly reactive to light. CN III, IV, VI: extraocular movement are normal. No ptosis. CN V: Facial sensation is intact to pinprick in all 3 divisions bilaterally. Corneal responses are intact.  CN VII: Face is symmetric with normal eye closure and smile. CN VIII: Hearing is normal to rubbing fingers CN IX, X: Palate elevates symmetrically. Phonation is normal. CN XI: Head turning and shoulder shrug are intact CN XII: Tongue is midline with normal movements and no  atrophy.  MOTOR: There is no pronator drift of out-stretched arms. Muscle bulk and tone are normal. Muscle strength is normal.  REFLEXES: Reflexes are 2+ and symmetric at the biceps, triceps, knees, and ankles. Plantar responses are flexor.  SENSORY: Intact to light touch, pinprick, positional sensation and vibratory sensation are intact in fingers and toes.  COORDINATION: Rapid alternating movements and fine finger movements are intact. There is no dysmetria on finger-to-nose and heel-knee-shin.    GAIT/STANCE: Posture is normal. Gait is steady with normal steps, base, arm swing, and turning. Heel and toe walking are normal. Tandem gait is normal.  Romberg is absent.   DIAGNOSTIC DATA (LABS, IMAGING, TESTING) - I reviewed patient records, labs, notes, testing and imaging myself where available.   ASSESSMENT AND PLAN  RASHAWN ROLON is a 57 y.o. 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 Ph.D.  [facility] [address], [place], [place] [address] Ph: [phone] Fax: [phone]  CC: Lennie Odor, PA-C

## 2018-10-27 ENCOUNTER — Ambulatory Visit: Payer: Self-pay | Admitting: Physical Medicine & Rehabilitation

## 2019-01-31 DIAGNOSIS — Z0289 Encounter for other administrative examinations: Secondary | ICD-10-CM

## 2019-10-17 NOTE — Progress Notes (Signed)
NEUROLOGY CONSULTATION NOTE  Michele Townsend MRN: [id number] DOB: 09-02-1961  Referring provider: Lennie Odor, PA-C Primary care provider: Lennie Odor, PA-C  Reason for consult:  Headaches and dizziness  HISTORY OF PRESENT ILLNESS: Michele Townsend is a 58 year old female who presents for headaches and dizziness.  History supplemented by prior neurologist and referring provider notes.  In December 2017, she hit the right frontal part of her head against the metal frame of a door while at work.  No loss of consciousness.  She was evaluated in the ED.  MRI of brain with and without contrast from 12/01/2016 was personally reviewed and demonstrated no acute intracranial abnormality but revealed possible occluded right internal carotid artery.  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  She was seen by vascular surgery who recommended no surgical intervention and she was started on statin therapy.  She has longstanding history of mild headaches.  However, they became worse following the accident.  Following this accident, she has had headaches daily all day long.  She wakes up with headaches and goes to bed with headache. Severity varies.  It may be severe 3 times a week.  They are pressure-like bifrontal/temporal/occipital (may be unilateral) and pain.  No associated nausea, vomiting, photophobia, phonophobia, osmophobia, visual disturbance or unilateral numbness and weakness.  Sometimes if she looks up, she feels a little dizzy.    She takes a pain reliever daily, whether it is an NSAID, Tylenol or tramadol. Current NSAIDS:  ASA 81mg ; ibuprofen Current analgesics:  Tylenol,  tramadol Current triptans:  none Current ergotamine:  none Current anti-emetic:  Promethazine 12.5mg  Current muscle relaxants:  Tizanidine 4mg  PRN (3 times a month) Current anti-anxiolytic:  Xanax, Buspar Current sleep aide:  melatonin Current Antihypertensive medications:  Lasix Current Antidepressant medications:  Cymbalta 60mg  Current Anticonvulsant medications:  Gabapentin 300mg  three times daily; lamotrigine 100mg  twice daily Current anti-CGRP:  none Current Vitamins/Herbal/Supplements:  none Current Antihistamines/Decongestants:  none Other therapy:  none Hormone/birth control:  none  Past NSAIDS:  Mobic, naproxen Past analgesics:  none Past abortive triptans:  Sumatriptan 50mg  Past abortive ergotamine:  none Past muscle relaxants:  Flexeril 10mg ; baclofen Past anti-emetic:  Zofran 8mg  Past antihypertensive medications:  Verapamil 80mg  three times daily; propranolol 20mg  three times daily; HCTZ Past antidepressant medications:  Amitriptyline 75mg  at bedtime (kept her awake); venlafaxine XR 75mg  daily; Paxil; Wellbutrin, Celexa Past anticonvulsant medications:  topiramate 200mg  twice daily; zonisamide 100mg  at bedtime Past anti-CGRP:  none  Caffeine:  No coffee.  Alcohol:  no Smoker:  yes Diet:  Drinks 1 caffeine-free Mt Dew daily Exercise:  Not routine because of back pain and sciatica. Depression:  yes; Anxiety:  yes Other pain:  Chronic neck, shoulder, back and sciatic pain. Sleep hygiene:  poor Family history of headache:  Several family members on her mother's side.  PAST MEDICAL HISTORY: Past Medical History:  Diagnosis Date  . Anxiety   . Anxiety   . Chronic low back pain   . Current smoker   . Depression   . GERD (gastroesophageal reflux disease)   . HLD (hyperlipidemia)   . IBS (irritable bowel syndrome)   . Migraine   . Sleep apnea  PAST SURGICAL HISTORY: Past Surgical History:  Procedure Laterality Date  . ABDOMINAL HYSTERECTOMY    .  PARATHYROIDECTOMY Right 04/14/2015  . PARATHYROIDECTOMY Right 04/14/2015   Procedure: RIGHT INFERIOR PARATHYROIDECTOMY;  Surgeon: Armandina Gemma, MD;  Location: [facility];  Service: General;  Laterality: Right;  . PARTIAL HYSTERECTOMY    . TUBAL LIGATION      MEDICATIONS: Current Outpatient Medications on File Prior to Visit  Medication Sig Dispense Refill  . ALPRAZolam (XANAX) 0.5 MG tablet Take 0.5 mg by mouth 2 (two) times daily as needed. For anxiety    . aspirin EC 81 MG tablet Take by mouth.    . busPIRone (BUSPAR) 5 MG tablet Take 5 mg by mouth 2 (two) times daily.    . citalopram (CELEXA) 40 MG tablet Take 40 mg by mouth daily.    Marland Kitchen gabapentin (NEURONTIN) 100 MG capsule TAKE 1 CAPSULE BY MOUTH ONCE DAILY IN THE MORNING  1  . lamoTRIgine (LAMICTAL) 100 MG tablet Take 1 tablet (100 mg total) by mouth 2 (two) times daily. 60 tablet 22  . lamoTRIgine (LAMICTAL) 25 MG tablet 1 tablet twice a day for the first week 2 tablets twice a day for the second week 3 tablets twice a day for the third week 4 tablets twice a day for the fourth week  For total of 140 tablets  After finish titration with small dose of lamotrigine 25 mg, change to lamotrigine 100 mg twice a day 140 tablet 0  . lovastatin (MEVACOR) 20 MG tablet Take 20 mg by mouth daily.    . Melatonin 1 MG TABS Take 0.5 tablets (0.5 mg total) by mouth at bedtime. (Patient taking differently: Take 5 mg by mouth at bedtime. ) 15 tablet 2  . mometasone-formoterol (DULERA) 100-5 MCG/ACT AERO Inhale 2 puffs into the lungs 2 (two) times daily.    . naproxen (NAPROSYN) 500 MG tablet TAKE 1 TABLET BY MOUTH TWICE DAILY WITH FOOD AS NEEDED FOR PAIN AND SWELLING  1  . promethazine (PHENERGAN) 12.5 MG tablet Take 1 tablet (12.5 mg total) by mouth every 8 (eight) hours as needed for nausea or vomiting. 90 tablet 1  . tiZANidine (ZANAFLEX) 4 MG tablet Take 1 tablet (4 mg total) by mouth every 6 (six) hours as needed for muscle spasms. 30 tablet 6   No  current facility-administered medications on file prior to visit.     ALLERGIES: No Known Allergies  FAMILY HISTORY: Family History  Problem Relation Age of Onset  . Hypertension Mother   . Hypertension Sister   . Breast cancer Maternal Aunt   . Breast cancer Maternal Aunt   . Lung cancer Father   .  SOCIAL HISTORY: Social History   Socioeconomic History  . Marital status: Married    Spouse name: Not on file  . Number of children: 1  . Years of education: 29  . Highest education level: High school graduate  Occupational History  . Occupation: unemployed - currently seeking disability  Social Needs  . Financial resource strain: Not on file  . Food insecurity    Worry: Not on file    Inability: Not on file  . Transportation needs    Medical: Not on file    Non-medical: Not on file  Tobacco Use  . Smoking status: Current Every Day Smoker    Packs/day: 1.00    Types: Cigarettes  . Smokeless tobacco: Never Used  Substance and Sexual Activity  . Alcohol use: No  .  Drug use: No  . Sexual activity: Yes    Birth control/protection: Surgical  Lifestyle  . Physical activity    Days per week: Not on file    Minutes per session: Not on file  . Stress: Not on file  Relationships  . Social Herbalist on phone: Not on file    Gets together: Not on file    Attends religious service: Not on file    Active member of club or organization: Not on file    Attends meetings of clubs or organizations: Not on file    Relationship status: Not on file  . Intimate partner violence    Fear of current or ex partner: Not on file    Emotionally abused: Not on file    Physically abused: Not on file    Forced sexual activity: Not on file  Other Topics Concern  . Not on file  Social History Narrative   Lives at home with her brother-in-law.   Right-handed.   Caffeine use:  5-6 cups per day.    REVIEW OF SYSTEMS: Constitutional: No fevers, chills, or sweats, no  generalized fatigue, change in appetite Eyes: No visual changes, double vision, eye pain Ear, nose and throat: No hearing loss, ear pain, nasal congestion, sore throat Cardiovascular: No chest pain, palpitations Respiratory:  No shortness of breath at rest or with exertion, wheezes GastrointestinaI: No nausea, vomiting, diarrhea, abdominal pain, fecal incontinence Genitourinary:  No dysuria, urinary retention or frequency Musculoskeletal:  No neck pain, back pain Integumentary: No rash, pruritus, skin lesions Neurological: as above Psychiatric: No depression, insomnia, anxiety Endocrine: No palpitations, fatigue, diaphoresis, mood swings, change in appetite, change in weight, increased thirst Hematologic/Lymphatic:  No purpura, petechiae. Allergic/Immunologic: no itchy/runny eyes, nasal congestion, recent allergic reactions, rashes  PHYSICAL EXAM: Blood pressure 118/81, pulse 89, height 5\' 5"  (1.651 m), weight 230 lb 6.4 oz (104.5 kg), SpO2 96 %. General: No acute distress.  Patient appears well-groomed.   Head:  Normocephalic/atraumatic Eyes:  fundi examined but not visualized Neck: supple, no paraspinal tenderness, full range of motion Back: No paraspinal tenderness Heart: regular rate and rhythm Lungs: Clear to auscultation bilaterally. Vascular: No carotid bruits. Neurological Exam: Mental status: alert and oriented to person, place, and time, recent and remote memory intact, fund of knowledge intact, attention and concentration intact, speech fluent and not dysarthric, language intact. Cranial nerves: CN I: not tested CN II: pupils equal, round and reactive to light, visual [place] intact CN III, IV, VI:  full range of motion, no nystagmus, no ptosis CN V: facial sensation intact CN VII: upper and lower face symmetric CN VIII: hearing intact CN IX, X: gag intact, uvula midline CN XI: sternocleidomastoid and trapezius muscles intact CN XII: tongue midline Bulk & Tone: normal,  no fasciculations. Motor:  5/5 throughout  Sensation: temperature and vibration sensation intact.   Deep Tendon Reflexes:  2+ throughout, toes downgoing.   Finger to nose testing:  Without dysmetria.   Heel to shin:  Without dysmetria.   Gait:  Normal station and stride.  Able to turn and tandem walk. Romberg negative.  IMPRESSION: Chronic tension-type headache, intractable.  Complicated by medication overuse  PLAN: 1.  Already on Cymbalta and gabapentin.  Takes tizanidine rarely as needed.  Will titrate her up to 2mg  three times daily (cautioned her about drowsiness). 2.  In two months, we can further titrate dose to 4mg  three times daily if needed. 3.  Limit use of pain  relievers to no more than 2 days out of week to prevent risk of rebound or medication-overuse headache. 4.  Follow up in 4 months.  Thank you for allowing me to take part in the care of this patient.  Metta Clines, DO  CC: Lennie Odor, PA-C

## 2019-10-18 ENCOUNTER — Other Ambulatory Visit: Payer: Self-pay

## 2019-10-18 ENCOUNTER — Encounter: Payer: Self-pay | Admitting: Neurology

## 2019-10-18 ENCOUNTER — Ambulatory Visit (INDEPENDENT_AMBULATORY_CARE_PROVIDER_SITE_OTHER): Payer: Medicaid Other | Admitting: Neurology

## 2019-10-18 VITALS — BP 118/81 | HR 89 | Ht 65.0 in | Wt 230.4 lb

## 2019-10-18 DIAGNOSIS — G44221 Chronic tension-type headache, intractable: Secondary | ICD-10-CM

## 2019-10-18 MED ORDER — TIZANIDINE HCL 2 MG PO TABS: 2.0000 mg | ORAL_TABLET | Freq: Three times a day (TID) | ORAL | 3 refills | Status: DC

## 2019-10-18 NOTE — Patient Instructions (Signed)
1.  Start tizanidine 2mg  tablet:  Take 1 tablet at bedtime for a week  Then 1 tablet twice daily for a week  Then 1 tablet three times daily. 2.  Contact me in 2 months with update and we can increase dose if needed. 3.  Limit use of pain relievers to no more than 2 days out of week to prevent risk of rebound or medication-overuse headache.  Try not to use tramadol if possible. 4.  Follow up in 4 months.

## 2019-10-19 ENCOUNTER — Telehealth: Payer: Self-pay | Admitting: Neurology

## 2019-10-19 MED ORDER — FLURBIPROFEN 100 MG PO TABS: 100.0000 mg | ORAL_TABLET | Freq: Three times a day (TID) | ORAL | 1 refills | Status: DC | PRN

## 2019-10-19 NOTE — Telephone Encounter (Signed)
We can prescribe flurbiprofen 100mg  tablet.  She may take three times daily as needed, maximum 3 tablets in 24 hours.  #24, Refill 1.    She needs to limit use to no more than 2 days out of week.  She should stop ibuprofen.

## 2019-10-19 NOTE — Telephone Encounter (Signed)
Spoke with patient she was informed of provider response. Rx sent to pharmacy.

## 2019-10-19 NOTE — Telephone Encounter (Signed)
Spoke with patient she states that it was discussed that she could take something for headaches that was similar to pain reliever OTC. Pt said she could get Rx for $3 with her Medicaid

## 2019-10-19 NOTE — Telephone Encounter (Signed)
Patient called requesting a prescription for a headache medication. She said she was going to try over the counter medication but has decided she'd like a prescription instead. She said she does not know the name of the medicine.  [facility] on [address]

## 2019-10-19 NOTE — Telephone Encounter (Signed)
Requested Prescriptions   Pending Prescriptions Disp Refills  . flurbiprofen (ANSAID) 100 MG tablet 24 tablet 1    Sig: Take 1 tablet (100 mg total) by mouth 3 (three) times daily as needed (maximum 3 tablets within 24 hours).   Rx sent

## 2019-11-14 ENCOUNTER — Telehealth: Payer: Self-pay | Admitting: Neurology

## 2019-11-14 NOTE — Telephone Encounter (Signed)
SEE BELOW.

## 2019-11-14 NOTE — Telephone Encounter (Signed)
Increase tizanidine to 4mg  three times daily.  Effects are not immediate.  I recommend giving this dose 4 weeks and if headaches still not improved, then contact me and we can make changes

## 2019-11-14 NOTE — Telephone Encounter (Signed)
Patient called and said her medication, tizanidine 2 MG, is not working well for her. She said she is continuing to have headaches in the morning and at night. She said there is a spot on her head in the back that is very sore to the touch but she hasn't injured herself.  [facility] [address]

## 2019-11-15 NOTE — Telephone Encounter (Signed)
Called patient no answer left message to call office back for provider response

## 2019-11-15 NOTE — Telephone Encounter (Signed)
Called spoke with patient she was informed of provider response and agrees She will call when she needs refills She has 2mg  tab now. She will take 2 tablet

## 2019-11-16 ENCOUNTER — Other Ambulatory Visit: Payer: Self-pay | Admitting: Neurology

## 2019-11-22 ENCOUNTER — Telehealth: Payer: Self-pay | Admitting: Neurology

## 2019-11-22 NOTE — Telephone Encounter (Signed)
Patient left a msg with after hours about not wanting to go back to Dr. Krista Blue at [facility]. She is wanting to only see Dr. Tomi Likens and wondered if he could refill her lamotrigine and Tizanidine medications for her that Dr. Krista Blue used to fill. Thanks!

## 2019-11-23 MED ORDER — LAMOTRIGINE 100 MG PO TABS: 100.0000 mg | ORAL_TABLET | Freq: Two times a day (BID) | ORAL | 0 refills | Status: DC

## 2019-11-23 NOTE — Telephone Encounter (Signed)
If she is going to be following up with me, then we can refill it.

## 2019-11-23 NOTE — Telephone Encounter (Signed)
Spoke with patient and medication clarified.  [facility] password updated for patient.

## 2019-12-13 ENCOUNTER — Telehealth: Payer: Self-pay | Admitting: Neurology

## 2019-12-13 NOTE — Telephone Encounter (Signed)
Patient is calling in about the new tizanidine medication- she is still having the headaches day and night. She is wanting to know what to do because this is not helping. Thanks!

## 2019-12-16 NOTE — Telephone Encounter (Signed)
If agreeable, I would have her discontinue Cymbalta and instead start sertraline 25mg  daily (this can help with headache as well as anxiety). Also, I want to be sure that she is limiting use of pain relievers to no more than 2 days out of week. She may stop tizanidine

## 2019-12-16 NOTE — Telephone Encounter (Signed)
When starting sertraline, take 25mg  daily for one week, then increase to 50mg  daily.

## 2019-12-18 ENCOUNTER — Other Ambulatory Visit: Payer: Self-pay

## 2019-12-18 MED ORDER — SERTRALINE HCL 25 MG PO TABS: 25.0000 mg | ORAL_TABLET | Freq: Every day | ORAL | 3 refills | Status: DC

## 2019-12-18 MED ORDER — SERTRALINE HCL 50 MG PO TABS: 50.0000 mg | ORAL_TABLET | Freq: Every day | ORAL | 3 refills | Status: DC

## 2019-12-18 NOTE — Telephone Encounter (Signed)
Pt informed to d/c Cymbalta and Tizanidine. She will limit her pain relievers to no more than 2x per week. She was made aware to start out with 25mg  daily and then to increase to 50mg  daily thereafter. Rx for 50mg  sent to [facility] on [address].

## 2019-12-19 ENCOUNTER — Telehealth: Payer: Self-pay | Admitting: Neurology

## 2019-12-19 NOTE — Telephone Encounter (Signed)
Patient is calling in about the new sertaline medication and has some questions for the nurse. Thanks!

## 2019-12-20 NOTE — Telephone Encounter (Signed)
Pt advised.

## 2019-12-27 ENCOUNTER — Other Ambulatory Visit: Payer: Self-pay | Admitting: Physician Assistant

## 2019-12-27 ENCOUNTER — Telehealth: Payer: Self-pay | Admitting: Neurology

## 2019-12-27 DIAGNOSIS — E2839 Other primary ovarian failure: Secondary | ICD-10-CM

## 2019-12-27 DIAGNOSIS — Z1231 Encounter for screening mammogram for malignant neoplasm of breast: Secondary | ICD-10-CM

## 2019-12-27 NOTE — Telephone Encounter (Signed)
Patient called needing to know if our office was needing to get a message to her? She said she doesn't have my chart or use it but she's getting a message from my chart. Please Advise. Thank you

## 2020-01-14 ENCOUNTER — Telehealth: Payer: Self-pay | Admitting: Neurology

## 2020-01-14 ENCOUNTER — Encounter: Payer: Self-pay | Admitting: Neurology

## 2020-01-14 NOTE — Telephone Encounter (Signed)
Patient called in regarding her having some confusion and headaches day and night constantly. She said that she is forgetting things a lot. She is scheduled to see Dr. Tomi Likens in March but she feels she needs a sooner appointment. She said this is really starting to bother her. She is taking the Generic of Zoloft and Dr. Tomi Likens had her stop 2 of her other medications. Please Call. Thank you

## 2020-01-14 NOTE — Telephone Encounter (Signed)
Can I schedule her for his next available vv this month?

## 2020-01-14 NOTE — Telephone Encounter (Signed)
Can u VV

## 2020-01-14 NOTE — Telephone Encounter (Signed)
She can make a follow up virtual visit.

## 2020-01-14 NOTE — Progress Notes (Addendum)
Virtual Visit via Video Note The purpose of this virtual visit is to provide medical care while limiting exposure to the novel coronavirus.    Consent was obtained for video visit:  Yes.   Answered questions that patient had about telehealth interaction:  Yes.   I discussed the limitations, risks, security and privacy concerns of performing an evaluation and management service by telemedicine. I also discussed with the patient that there may be a patient responsible charge related to this service. The patient expressed understanding and agreed to proceed.  Pt location: Home Physician Location: office Name of referring provider:  Lennie Odor, [place] I connected with Michele Townsend at patients initiation/request on 01/16/2020 at  9:50 AM EST by video enabled telemedicine application and verified that I am speaking with the correct person using two identifiers. Pt MRN:  [id number] Pt DOB:  [birth year] Video Participants:  Michele Townsend   History of Present Illness:  Michele Townsend is a 59 year old female who follows up for headaches.  UPDATE:  Started on tizanidine, which was ineffective.  Cymbalta was then switched to sertraline.  She says the headaches are overall unchanged but she has had some more severe headaches (in tears) and within the last month she started having confusion.  If she reads something and can't remember what she just read.  She forgets what she needs to buy in the grocery store.  She has been referred to a pain specialist for her other chronic pain.  Plan is to discontinue Xanax and promethazine.    Current NSAIDS:  ASA 81mg ; ibuprofen Current analgesics:  Tylenol, tramadol Current triptans:  none Current ergotamine:  none Current anti-emetic:  Promethazine 12.5mg  Current muscle relaxants:  none Current anti-anxiolytic:  Xanax, Buspar Current sleep aide:  melatonin Current Antihypertensive medications:  Lasix Current Antidepressant medications:  sertraline  50mg  Current Anticonvulsant medications:  Gabapentin 300mg  three times daily; lamotrigine 100mg  twice daily Current anti-CGRP:  none Current Vitamins/Herbal/Supplements:  none Current Antihistamines/Decongestants:  none Other therapy:  none Hormone/birth control:  none  Caffeine:  No coffee.  Alcohol:  no Smoker:  yes Diet:  Drinks 1 caffeine-free Mt Dew daily Exercise:  Not routine because of back pain and sciatica. Depression:  yes; Anxiety:  yes Other pain:  Chronic neck, shoulder, back and sciatic pain. Sleep hygiene:  poor  HISTORY: In December 2017, she hit the right frontal part of her head against the metal frame of a door while at work.  No loss of consciousness.  She was evaluated in the ED.  MRI of brain with and without contrast from 12/01/2016 was personally reviewed and demonstrated no acute intracranial abnormality but revealed possible occluded right internal carotid artery.  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  She was seen by vascular surgery who recommended no surgical intervention and she was started on statin therapy.  She has longstanding history of mild headaches.  However, they became worse following the accident.  Following this accident, she has had headaches daily all day long.  She wakes up with headaches and goes to bed with headache. Severity varies.  It may be severe 3 times a week.  They are pressure-like bifrontal/temporal/occipital (may be unilateral) and  pain.  No associated nausea, vomiting, photophobia, phonophobia, osmophobia, visual disturbance or unilateral numbness and weakness.  Sometimes if she looks up, she feels a little  dizzy.    Past NSAIDS:  Mobic, naproxen Past analgesics:  none Past abortive triptans:  Sumatriptan 50mg  Past abortive ergotamine:  none Past muscle relaxants:  Flexeril 10mg ; baclofen; tizanidine Past anti-emetic:  Zofran 8mg ; tizanidine 2mg  Past antihypertensive medications:  Verapamil 80mg  three times daily; propranolol 20mg  three times daily; HCTZ Past antidepressant medications:  Amitriptyline 75mg  at bedtime (kept her awake); venlafaxine XR 75mg  daily; Paxil; Wellbutrin, Celexa; Cymbalta 60mg  Past anticonvulsant medications:  topiramate 200mg  twice daily; zonisamide 100mg  at bedtime Past anti-CGRP:  none   Family history of headache:  Several family members on her mother's side.  Past Medical History: Past Medical History:  Diagnosis Date   Anxiety    Anxiety    Chronic low back pain    Current smoker    Depression    GERD (gastroesophageal reflux disease)    HLD (hyperlipidemia)    IBS (irritable bowel syndrome)    Migraine    Sleep apnea     Medications: Outpatient Encounter Medications as of 01/16/2020  Medication Sig   ALPRAZolam (XANAX) 0.5 MG tablet Take 0.5 mg by mouth 2 (two) times daily as needed. For anxiety   aspirin EC 81 MG tablet Take by mouth.   atorvastatin (LIPITOR) 20 MG tablet Take 20 mg by mouth daily.   busPIRone (BUSPAR) 5 MG tablet Take 5 mg by mouth 2 (two) times daily.   famotidine (PEPCID) 20 MG tablet Take 20 mg by mouth 2 (two) times daily.   flurbiprofen (ANSAID) 100 MG tablet Take 1 tablet (100 mg total) by mouth 3 (three) times daily as needed (maximum 3 tablets within 24 hours).   furosemide (LASIX) 20 MG tablet Take 20 mg by mouth.   gabapentin (NEURONTIN) 300 MG capsule Take 300 mg by mouth 3 (three) times daily.   lamoTRIgine (LAMICTAL) 100 MG tablet Take 1 tablet (100 mg total) by mouth 2 (two) times daily.   Melatonin 1 MG TABS Take 0.5 tablets (0.5 mg total) by mouth at bedtime. (Patient taking differently:  Take 5 mg by mouth at bedtime. )   metoprolol tartrate (LOPRESSOR) 50 MG tablet Take 50 mg by mouth 2 (two) times daily.   promethazine (PHENERGAN) 12.5 MG tablet Take 1 tablet (12.5 mg total) by mouth every 8 (eight) hours as needed for nausea or vomiting.   sertraline (ZOLOFT) 50 MG tablet Take 1 tablet (50 mg total) by mouth daily. Take 25mg  daily for 1 week then increase to 50mg  daily thereafter   traMADol (ULTRAM) 50 MG tablet SMARTSIG:1 Tablet(s) By Mouth Every 8-12 Hours PRN   lovastatin (MEVACOR) 20 MG tablet Take 20 mg by mouth daily.   tiZANidine (ZANAFLEX) 2 MG tablet Take 1 tablet (2 mg total) by mouth 3 (three) times daily.   No facility-administered encounter medications on file as of 01/16/2020.    Allergies: No Known Allergies  Family History: Family History  Problem Relation Age of Onset   Hypertension Mother    Hypertension Sister    Breast cancer Maternal Aunt    Breast cancer Maternal Aunt    Lung cancer Father    Healthy Brother        step brothers   Healthy Son     Social History: Social History   Socioeconomic History   Marital status: Married    Spouse name: Not on  file   Number of children: 1   Years of education: 12   Highest education level: High school graduate  Occupational History   Occupation: unemployed - currently seeking disability  Tobacco Use   Smoking status: Current Every Day Smoker    Packs/day: 1.00    Types: Cigarettes   Smokeless tobacco: Never Used  Substance and Sexual Activity   Alcohol use: No   Drug use: No   Sexual activity: Yes    Birth control/protection: Surgical  Other Topics Concern   Not on file  Social History Narrative   Lives at home with her brother-in-law.   Right-handed.   Caffeine use:  5-6 cups per day.   One story home   She has 1 son   Social Determinants of Radio broadcast assistant Strain:    Difficulty of Paying Living Expenses: Not on file  Food Insecurity:     Worried About Charity fundraiser in the Last Year: Not on file   [employer] of Food in the Last Year: Not on file  Transportation Needs:    Lack of Transportation (Medical): Not on file   Lack of Transportation (Non-Medical): Not on file  Physical Activity:    Days of Exercise per Week: Not on file   Minutes of Exercise per Session: Not on file  Stress:    Feeling of Stress : Not on file  Social Connections:    Frequency of Communication with Friends and Family: Not on file   Frequency of Social Gatherings with Friends and Family: Not on file   Attends Religious Services: Not on file   Active Member of Clubs or Organizations: Not on file   Attends Archivist Meetings: Not on file   Marital Status: Not on file  Intimate Partner Violence:    Fear of Current or Ex-Partner: Not on file   Emotionally Abused: Not on file   Physically Abused: Not on file   Sexually Abused: Not on file    Observations/Objective:   There were no vitals filed for this visit.  Alert and oriented.  Speech fluent and not dysarthric.  Language intact. Montreal Cognitive Assessment Blind 01/17/2020  Attention: Read list of digits (0/2) 2  Attention: Read list of letters (0/1) 1  Attention: Serial 7 subtraction starting at 100 (0/3) 2  Language: Repeat phrase (0/2) 2  Language : Fluency (0/1) 0  Abstraction (0/2) 2  Delayed Recall (0/5) 4  Orientation (0/6) 6  Total 19  Adjusted Score (based on education) 20   Assessment and Plan:   1.  Chronic tension-type headache, intractable. 2.  Memory deficits.  She reports worsening headaches and memory deficits.  I suspect that the memory deficits may be side effects from sertraline.  I will have her stop sertraline and she may restart duloxetine.  However, I would still want to rule out a secondary intracranial etiology given that she also reports worsening headaches.  Further recommendations pending results.  As for treatment of her  headaches, I am afraid I would not be able to help her.  She has tried multiple medications used for chronic tension-type or chronic post-traumatic headaches.  If MRI reveals no acute abnormalities, I would recommend that her PCP refer to another specialist.  Follow Up Instructions:    -I discussed the assessment and treatment plan with the patient. The patient was provided an opportunity to ask questions and all were answered. The patient agreed with the plan and demonstrated an  understanding of the instructions.   The patient was advised to call back or seek an in-person evaluation if the symptoms worsen or if the condition fails to improve as anticipated.   Dudley Major, DO   ADDENDUM: MoCA-BLIND Score: Montreal Cognitive Assessment Blind 01/17/2020  Attention: Read list of digits (0/2) 2  Attention: Read list of letters (0/1) 1  Attention: Serial 7 subtraction starting at 100 (0/3) 2  Language: Repeat phrase (0/2) 2  Language : Fluency (0/1) 0  Abstraction (0/2) 2  Delayed Recall (0/5) 4  Orientation (0/6) 6  Total 19  Adjusted Score (based on education) Carl, DO

## 2020-01-14 NOTE — Telephone Encounter (Signed)
Any suggestions for patient?

## 2020-01-16 ENCOUNTER — Other Ambulatory Visit: Payer: Self-pay

## 2020-01-16 ENCOUNTER — Encounter: Payer: Self-pay | Admitting: Neurology

## 2020-01-16 ENCOUNTER — Telehealth (INDEPENDENT_AMBULATORY_CARE_PROVIDER_SITE_OTHER): Payer: Medicaid Other | Admitting: Neurology

## 2020-01-16 ENCOUNTER — Telehealth: Payer: Self-pay

## 2020-01-16 VITALS — Ht 64.0 in | Wt 230.0 lb

## 2020-01-16 DIAGNOSIS — R413 Other amnesia: Secondary | ICD-10-CM | POA: Diagnosis not present

## 2020-01-16 DIAGNOSIS — G44221 Chronic tension-type headache, intractable: Secondary | ICD-10-CM

## 2020-01-16 MED ORDER — DULOXETINE HCL 30 MG PO CPEP: ORAL_CAPSULE | ORAL | 0 refills | Status: DC

## 2020-01-16 NOTE — Telephone Encounter (Signed)
Close encounter 

## 2020-01-17 ENCOUNTER — Telehealth: Payer: Self-pay | Admitting: Neurology

## 2020-01-17 NOTE — Telephone Encounter (Signed)
Yes.  I would like someone to call her to perform a MoCA- Blind if possible.

## 2020-01-17 NOTE — Telephone Encounter (Signed)
Patient called to follow up from her virtual appointment yesterday with Dr. Tomi Likens.   She said she was told someone would be calling her back yesterday or today to do a memory test over the phone. Patient is waiting to hear from someone about this.

## 2020-01-17 NOTE — Telephone Encounter (Signed)
Called moca blind done

## 2020-01-30 ENCOUNTER — Telehealth: Payer: Self-pay | Admitting: Neurology

## 2020-01-30 NOTE — Telephone Encounter (Signed)
Patient left msg with after hours wanting her recent test results- I think its labs. Then also wants to know why her MRI isnt scheduled. Thanks!

## 2020-01-30 NOTE — Telephone Encounter (Signed)
Please see MOCO and advise per Patient, will call back on Thursday, thanks, She is going to check at [facility] imaging for MRI appt.

## 2020-01-31 NOTE — Telephone Encounter (Signed)
Her cognitive score for MOCA-Blind fell within normal range.

## 2020-02-01 NOTE — Telephone Encounter (Signed)
Mri is scheduled for march 12

## 2020-02-13 ENCOUNTER — Ambulatory Visit
Admission: RE | Admit: 2020-02-13 | Discharge: 2020-02-13 | Disposition: A | Discharge status: Home / Self Care | Payer: Medicaid Other | Source: Ambulatory Visit | Attending: Physician Assistant | Admitting: Physician Assistant

## 2020-02-13 ENCOUNTER — Other Ambulatory Visit: Payer: Self-pay | Admitting: Physician Assistant

## 2020-02-13 DIAGNOSIS — R0602 Shortness of breath: Secondary | ICD-10-CM

## 2020-02-19 ENCOUNTER — Ambulatory Visit: Payer: Self-pay | Admitting: Neurology

## 2020-02-25 ENCOUNTER — Ambulatory Visit: Payer: Medicaid Other | Admitting: Neurology

## 2020-02-28 ENCOUNTER — Ambulatory Visit
Admission: RE | Admit: 2020-02-28 | Discharge: 2020-02-28 | Disposition: A | Discharge status: Home / Self Care | Payer: Medicaid Other | Source: Ambulatory Visit | Attending: Neurology | Admitting: Neurology

## 2020-02-28 DIAGNOSIS — R413 Other amnesia: Secondary | ICD-10-CM

## 2020-02-29 ENCOUNTER — Other Ambulatory Visit: Payer: Self-pay | Admitting: Neurology

## 2020-03-12 ENCOUNTER — Ambulatory Visit
Admission: RE | Admit: 2020-03-12 | Discharge: 2020-03-12 | Disposition: A | Discharge status: Home / Self Care | Payer: Medicaid Other | Source: Ambulatory Visit | Attending: Physician Assistant | Admitting: Physician Assistant

## 2020-03-12 ENCOUNTER — Other Ambulatory Visit: Payer: Self-pay

## 2020-03-12 DIAGNOSIS — E2839 Other primary ovarian failure: Secondary | ICD-10-CM

## 2020-03-12 DIAGNOSIS — Z1231 Encounter for screening mammogram for malignant neoplasm of breast: Secondary | ICD-10-CM

## 2020-03-27 ENCOUNTER — Other Ambulatory Visit: Payer: Self-pay | Admitting: Neurology

## 2020-04-02 ENCOUNTER — Telehealth: Payer: Self-pay | Admitting: Neurology

## 2020-04-02 NOTE — Telephone Encounter (Signed)
Pt is would like to speak to someone about something that Pain clinic needs about her MRI

## 2020-04-02 NOTE — Telephone Encounter (Signed)
Pt seen by Dr. Primus Bravo in Management. Provider requesting a copy of the pt MRI Brain.  Tried calling pt provider to see if he is linked or using Epic. If not pt will have to fill out a Records release.   NO answer.

## 2020-04-02 NOTE — Telephone Encounter (Signed)
LMOVM For pt to come by the office to fill out medical release form or go by DR. Hamblen office to fill out

## 2020-04-23 ENCOUNTER — Other Ambulatory Visit: Payer: Self-pay | Admitting: Neurology

## 2020-05-10 ENCOUNTER — Other Ambulatory Visit: Payer: Self-pay | Admitting: Neurology

## 2020-05-22 ENCOUNTER — Other Ambulatory Visit: Payer: Self-pay | Admitting: Neurology

## 2020-07-11 ENCOUNTER — Encounter: Payer: Self-pay | Admitting: Physical Medicine & Rehabilitation

## 2020-07-17 ENCOUNTER — Encounter: Payer: Medicaid Other | Attending: Physical Medicine & Rehabilitation | Admitting: Physical Medicine & Rehabilitation

## 2020-07-24 ENCOUNTER — Encounter: Payer: Medicaid Other | Admitting: Physical Medicine & Rehabilitation

## 2020-09-26 ENCOUNTER — Ambulatory Visit (INDEPENDENT_AMBULATORY_CARE_PROVIDER_SITE_OTHER): Payer: Medicaid Other

## 2020-09-26 ENCOUNTER — Other Ambulatory Visit: Payer: Self-pay

## 2020-09-26 ENCOUNTER — Ambulatory Visit (INDEPENDENT_AMBULATORY_CARE_PROVIDER_SITE_OTHER): Payer: Medicaid Other | Admitting: Podiatry

## 2020-09-26 ENCOUNTER — Encounter: Payer: Self-pay | Admitting: Podiatry

## 2020-09-26 DIAGNOSIS — S9032XA Contusion of left foot, initial encounter: Secondary | ICD-10-CM

## 2020-09-26 DIAGNOSIS — M722 Plantar fascial fibromatosis: Secondary | ICD-10-CM | POA: Diagnosis not present

## 2020-09-26 NOTE — Patient Instructions (Signed)

## 2020-09-30 NOTE — Progress Notes (Signed)
Subjective:   Patient ID: Michele Townsend, female   DOB: 59 y.o.   MRN: [id number]   HPI Patient presents stating she has been getting pain in her left foot and heel over the last 4 months.  This is never occurred before and she does not really remember injury and she does smoke 1 pack/day and would like to be more active if possible   Review of Systems  All other systems reviewed and are negative.       Objective:  Physical Exam Vitals and nursing note reviewed.  Constitutional:      Appearance: She is well-developed.  Pulmonary:     Effort: Pulmonary effort is normal.  Musculoskeletal:        General: Normal range of motion.  Skin:    General: Skin is warm.  Neurological:     Mental Status: She is alert.     Neurovascular status was found to be intact muscle strength was found to be adequate range of motion within normal limits.  Patient is found to have discomfort in the plantar heel left at the insertional point tendon calcaneus inflammation fluid around the medial band and was found to have good digital perfusion well oriented x3 with moderate depression of the arch acute     Assessment:  Plantar fasciitis left that is very painful when pressed with probable structural deficiency     Plan:  H&P conditions reviewed explained x-ray reviewed.  Today I did sterile prep injected the plantar fascial left 3 mg Kenalog 5 g liken discussed physical therapy anti-inflammatories and shoe gear modifications and patient will be seen back to reevaluate  X-rays indicate spur but no indication stress fracture or advanced arthritis signed visit

## 2020-10-07 ENCOUNTER — Other Ambulatory Visit (HOSPITAL_BASED_OUTPATIENT_CLINIC_OR_DEPARTMENT_OTHER): Payer: Self-pay

## 2020-10-07 DIAGNOSIS — R0683 Snoring: Secondary | ICD-10-CM

## 2020-10-07 DIAGNOSIS — R0681 Apnea, not elsewhere classified: Secondary | ICD-10-CM

## 2020-10-07 DIAGNOSIS — G4710 Hypersomnia, unspecified: Secondary | ICD-10-CM

## 2020-11-01 ENCOUNTER — Encounter (HOSPITAL_BASED_OUTPATIENT_CLINIC_OR_DEPARTMENT_OTHER): Payer: Medicaid Other | Admitting: Internal Medicine

## 2020-12-22 ENCOUNTER — Ambulatory Visit
Admission: RE | Admit: 2020-12-22 | Discharge: 2020-12-22 | Disposition: A | Discharge status: Home / Self Care | Payer: Medicare Other | Source: Ambulatory Visit | Attending: Physician Assistant | Admitting: Physician Assistant

## 2020-12-22 ENCOUNTER — Other Ambulatory Visit: Payer: Self-pay | Admitting: Physician Assistant

## 2020-12-22 DIAGNOSIS — R109 Unspecified abdominal pain: Secondary | ICD-10-CM

## 2020-12-25 ENCOUNTER — Other Ambulatory Visit: Payer: Self-pay | Admitting: Physician Assistant

## 2020-12-25 DIAGNOSIS — Z1231 Encounter for screening mammogram for malignant neoplasm of breast: Secondary | ICD-10-CM

## 2021-02-09 ENCOUNTER — Other Ambulatory Visit: Payer: Self-pay | Admitting: *Deleted

## 2021-02-09 DIAGNOSIS — F17210 Nicotine dependence, cigarettes, uncomplicated: Secondary | ICD-10-CM

## 2021-02-09 DIAGNOSIS — Z87891 Personal history of nicotine dependence: Secondary | ICD-10-CM

## 2021-03-11 ENCOUNTER — Encounter: Payer: Medicare Other | Admitting: Acute Care

## 2021-03-11 ENCOUNTER — Ambulatory Visit: Payer: Medicare Other

## 2021-03-13 ENCOUNTER — Other Ambulatory Visit: Payer: Self-pay

## 2021-03-13 ENCOUNTER — Ambulatory Visit
Admission: RE | Admit: 2021-03-13 | Discharge: 2021-03-13 | Disposition: A | Discharge status: Home / Self Care | Payer: Medicare Other | Source: Ambulatory Visit | Attending: Physician Assistant | Admitting: Physician Assistant

## 2021-03-13 DIAGNOSIS — Z1231 Encounter for screening mammogram for malignant neoplasm of breast: Secondary | ICD-10-CM

## 2021-05-13 ENCOUNTER — Other Ambulatory Visit: Payer: Self-pay

## 2021-05-13 ENCOUNTER — Ambulatory Visit
Admission: RE | Admit: 2021-05-13 | Discharge: 2021-05-13 | Disposition: A | Discharge status: Home / Self Care | Payer: Medicare Other | Source: Ambulatory Visit | Attending: Acute Care | Admitting: Acute Care

## 2021-05-13 ENCOUNTER — Encounter: Payer: Self-pay | Admitting: Acute Care

## 2021-05-13 ENCOUNTER — Ambulatory Visit (INDEPENDENT_AMBULATORY_CARE_PROVIDER_SITE_OTHER): Payer: Medicare Other | Admitting: Acute Care

## 2021-05-13 VITALS — BP 124/84 | HR 79 | Temp 98.5°F | Ht 65.0 in | Wt 226.4 lb

## 2021-05-13 DIAGNOSIS — F17210 Nicotine dependence, cigarettes, uncomplicated: Secondary | ICD-10-CM

## 2021-05-13 DIAGNOSIS — Z87891 Personal history of nicotine dependence: Secondary | ICD-10-CM

## 2021-05-13 NOTE — Progress Notes (Signed)
Shared Decision Making Visit Lung Cancer Screening Program [phone])   Eligibility:  Age 60 y.o.  Pack Years Smoking History Calculation 53 pack year smoking history (# packs/per year x # years smoked)  Recent History of coughing up blood  no  Unexplained weight loss? no ( >Than 15 pounds within the last 6 months )  Prior History Lung / other cancer no (Diagnosis within the last 5 years already requiring surveillance chest CT Scans).  Smoking Status Current Smoker  Former Smokers: Years since quit: None  Quit Date: NA  Visit Components:  Discussion included one or more decision making aids. yes  Discussion included risk/benefits of screening. yes  Discussion included potential follow up diagnostic testing for abnormal scans. yes  Discussion included meaning and risk of over diagnosis. yes  Discussion included meaning and risk of False Positives. yes  Discussion included meaning of total radiation exposure. yes  Counseling Included:  Importance of adherence to annual lung cancer LDCT screening. yes  Impact of comorbidities on ability to participate in the program. yes  Ability and willingness to under diagnostic treatment. yes  Smoking Cessation Counseling:  Current Smokers:   Discussed importance of smoking cessation. yes  Information about tobacco cessation classes and interventions provided to patient. yes  Patient provided with "ticket" for LDCT Scan. yes  Symptomatic Patient. no  Counseling  Diagnosis Code: Tobacco Use Z72.0  Asymptomatic Patient yes  Counseling (Intermediate counseling: > three minutes counseling) C1448  Former Smokers:   Discussed the importance of maintaining cigarette abstinence. yes  Diagnosis Code: Personal History of Nicotine Dependence. J85.631  Information about tobacco cessation classes and interventions provided to patient. Yes  Patient provided with "ticket" for LDCT Scan. yes  Written Order for Lung Cancer  Screening with LDCT placed in Epic. Yes (CT Chest Lung Cancer Screening Low Dose W/O CM) [id number] Z12.2-Screening of respiratory organs Z87.891-Personal history of nicotine dependence  I have spent 25 minutes of face to face time with Ms. Cahall discussing the risks and benefits of lung cancer screening. We viewed a power point together that explained in detail the above noted topics. We paused at intervals to allow for questions to be asked and answered to ensure understanding.We discussed that the single most powerful action that she can take to decrease her risk of developing lung cancer is to quit smoking. We discussed whether or not she is ready to commit to setting a quit date. We discussed options for tools to aid in quitting smoking including nicotine replacement therapy, non-nicotine medications, support groups, Quit Smart classes, and behavior modification. We discussed that often times setting smaller, more achievable goals, such as eliminating 1 cigarette a day for a week and then 2 cigarettes a day for a week can be helpful in slowly decreasing the number of cigarettes smoked. This allows for a sense of accomplishment as well as providing a clinical benefit. I gave her the " Be Stronger Than Your Excuses" card with contact information for community resources, classes, free nicotine replacement therapy, and access to mobile apps, text messaging, and on-line smoking cessation help. I have also given her my card and contact information in the event she needs to contact me. We discussed the time and location of the scan, and that either Doroteo Glassman RN or I will call with the results within 24-48 hours of receiving them. I have offered her  a copy of the power point we viewed  as a resource in the event they need reinforcement of  the concepts we discussed today in the office. The patient verbalized understanding of all of  the above and had no further questions upon leaving the office. They have my  contact information in the event they have any further questions.  I spent 3-4 minutes counseling on smoking cessation and the health risks of continued tobacco abuse.  I explained to the patient that there has been a high incidence of coronary artery disease noted on these exams. I explained that this is a non-gated exam therefore degree or severity cannot be determined. This patient is on statin therapy. I have asked the patient to follow-up with their PCP regarding any incidental finding of coronary artery disease and management with diet or medication as their PCP  feels is clinically indicated. The patient verbalized understanding of the above and had no further questions upon completion of the visit.      Magdalen Spatz, NP

## 2021-05-13 NOTE — Patient Instructions (Signed)
Thank you for participating in the [facility] Screening Program. It was our pleasure to meet you today. We will call you with the results of your scan within the next few days. Your scan will be assigned a Lung RADS category score by the physicians reading the scans.  This Lung RADS score determines follow up scanning.  See below for description of categories, and follow up screening recommendations. We will be in touch to schedule your follow up screening annually or based on recommendations of our providers. We will fax a copy of your scan results to your Primary Care Physician, or the physician who referred you to the program, to ensure they have the results. Please call the office if you have any questions or concerns regarding your scanning experience or results.  Our office number is [phone]. Please speak with Denise Phelps, RN. She is our Lung Cancer Screening RN. If she is unavailable when you call, please have the office staff send her a message. She will return your call at her earliest convenience. Remember, if your scan is normal, we will scan you annually as long as you continue to meet the criteria for the program. (Age 55-77, Current smoker or smoker who has quit within the last 15 years). If you are a smoker, remember, quitting is the single most powerful action that you can take to decrease your risk of lung cancer and other pulmonary, breathing related problems. We know quitting is hard, and we are here to help.  Please let us know if there is anything we can do to help you meet your goal of quitting. If you are a former smoker, congratulations. We are proud of you! Remain smoke free! Remember you can refer friends or family members through the number above.  We will screen them to make sure they meet criteria for the program. Thank you for helping us take better care of you by participating in Lung Screening.  Lung RADS Categories:  Lung RADS 1: no nodules  or definitely non-concerning nodules.  Recommendation is for a repeat annual scan in 12 months.  Lung RADS 2:  nodules that are non-concerning in appearance and behavior with a very low likelihood of becoming an active cancer. Recommendation is for a repeat annual scan in 12 months.  Lung RADS 3: nodules that are probably non-concerning , includes nodules with a low likelihood of becoming an active cancer.  Recommendation is for a 6-month repeat screening scan. Often noted after an upper respiratory illness. We will be in touch to make sure you have no questions, and to schedule your 6-month scan.  Lung RADS 4 A: nodules with concerning findings, recommendation is most often for a follow up scan in 3 months or additional testing based on our provider's assessment of the scan. We will be in touch to make sure you have no questions and to schedule the recommended 3 month follow up scan.  Lung RADS 4 B:  indicates findings that are concerning. We will be in touch with you to schedule additional diagnostic testing based on our provider's  assessment of the scan.   

## 2021-05-18 ENCOUNTER — Telehealth: Payer: Self-pay | Admitting: Acute Care

## 2021-05-18 DIAGNOSIS — F17210 Nicotine dependence, cigarettes, uncomplicated: Secondary | ICD-10-CM

## 2021-05-18 DIAGNOSIS — Z87891 Personal history of nicotine dependence: Secondary | ICD-10-CM

## 2021-05-18 NOTE — Telephone Encounter (Signed)
Pt informed of CT results per Sarah Groce, NP.  PT verbalized understanding.  Copy of CT sent to PCP.  Order placed for 1 yr f/u CT. ° °

## 2021-05-21 NOTE — Progress Notes (Signed)
Please call patient and let them  know their  low dose Ct was read as a Lung RADS 2: nodules that are benign in appearance and behavior with a very low likelihood of becoming a clinically active cancer due to size or lack of growth. Recommendation per radiology is for a repeat LDCT in 12 months. .Please let them  know we will order and schedule their  annual screening scan for 05/2022. Please let them  know there was notation of CAD on their  scan.  Please remind the patient  that this is a non-gated exam therefore degree or severity of disease  cannot be determined. Please have them  follow up with their PCP regarding potential risk factor modification, dietary therapy or pharmacologic therapy if clinically indicated. Pt.  is  currently on statin therapy. Please place order for annual  screening scan for  05/2022 and fax results to PCP. Thanks so much.  + for aortic atherosclerosis, in addition to left anterior descending coronary artery disease. She is on statins.  Have her follow up with PCP. Thanks so much

## 2022-04-23 ENCOUNTER — Other Ambulatory Visit: Payer: Self-pay | Admitting: Physician Assistant

## 2022-04-23 DIAGNOSIS — Z1231 Encounter for screening mammogram for malignant neoplasm of breast: Secondary | ICD-10-CM

## 2022-05-04 ENCOUNTER — Ambulatory Visit
Admission: RE | Admit: 2022-05-04 | Discharge: 2022-05-04 | Disposition: A | Discharge status: Home / Self Care | Payer: Medicare Other | Source: Ambulatory Visit | Attending: Physician Assistant | Admitting: Physician Assistant

## 2022-05-04 DIAGNOSIS — Z1231 Encounter for screening mammogram for malignant neoplasm of breast: Secondary | ICD-10-CM

## 2022-05-13 ENCOUNTER — Ambulatory Visit
Admission: RE | Admit: 2022-05-13 | Discharge: 2022-05-13 | Disposition: A | Discharge status: Home / Self Care | Payer: Medicare Other | Source: Ambulatory Visit

## 2022-05-13 DIAGNOSIS — Z87891 Personal history of nicotine dependence: Secondary | ICD-10-CM

## 2022-05-13 DIAGNOSIS — F17210 Nicotine dependence, cigarettes, uncomplicated: Secondary | ICD-10-CM

## 2022-05-14 ENCOUNTER — Other Ambulatory Visit: Payer: Self-pay

## 2022-05-14 DIAGNOSIS — F17210 Nicotine dependence, cigarettes, uncomplicated: Secondary | ICD-10-CM

## 2022-05-14 DIAGNOSIS — Z122 Encounter for screening for malignant neoplasm of respiratory organs: Secondary | ICD-10-CM

## 2022-05-14 DIAGNOSIS — Z87891 Personal history of nicotine dependence: Secondary | ICD-10-CM

## 2022-06-12 DIAGNOSIS — G4700 Insomnia, unspecified: Secondary | ICD-10-CM | POA: Diagnosis not present

## 2022-06-12 DIAGNOSIS — F411 Generalized anxiety disorder: Secondary | ICD-10-CM | POA: Diagnosis not present

## 2022-06-12 DIAGNOSIS — G2401 Drug induced subacute dyskinesia: Secondary | ICD-10-CM | POA: Diagnosis not present

## 2022-06-22 DIAGNOSIS — Z6831 Body mass index (BMI) 31.0-31.9, adult: Secondary | ICD-10-CM | POA: Diagnosis not present

## 2022-06-22 DIAGNOSIS — N951 Menopausal and female climacteric states: Secondary | ICD-10-CM | POA: Diagnosis not present

## 2022-06-22 DIAGNOSIS — M545 Low back pain, unspecified: Secondary | ICD-10-CM | POA: Diagnosis not present

## 2022-06-22 DIAGNOSIS — E2740 Unspecified adrenocortical insufficiency: Secondary | ICD-10-CM | POA: Diagnosis not present

## 2022-06-22 DIAGNOSIS — Z79899 Other long term (current) drug therapy: Secondary | ICD-10-CM | POA: Diagnosis not present

## 2022-07-20 DIAGNOSIS — M25512 Pain in left shoulder: Secondary | ICD-10-CM | POA: Diagnosis not present

## 2022-07-20 DIAGNOSIS — Z6832 Body mass index (BMI) 32.0-32.9, adult: Secondary | ICD-10-CM | POA: Diagnosis not present

## 2022-07-20 DIAGNOSIS — Z79899 Other long term (current) drug therapy: Secondary | ICD-10-CM | POA: Diagnosis not present

## 2022-07-20 DIAGNOSIS — G2401 Drug induced subacute dyskinesia: Secondary | ICD-10-CM | POA: Diagnosis not present

## 2022-07-20 DIAGNOSIS — F419 Anxiety disorder, unspecified: Secondary | ICD-10-CM | POA: Diagnosis not present

## 2022-07-20 DIAGNOSIS — G4700 Insomnia, unspecified: Secondary | ICD-10-CM | POA: Diagnosis not present

## 2022-07-20 DIAGNOSIS — R110 Nausea: Secondary | ICD-10-CM | POA: Diagnosis not present

## 2022-07-20 DIAGNOSIS — M545 Low back pain, unspecified: Secondary | ICD-10-CM | POA: Diagnosis not present

## 2022-07-26 DIAGNOSIS — Z79899 Other long term (current) drug therapy: Secondary | ICD-10-CM | POA: Diagnosis not present

## 2022-08-24 DIAGNOSIS — M545 Low back pain, unspecified: Secondary | ICD-10-CM | POA: Diagnosis not present

## 2022-08-24 DIAGNOSIS — Z6832 Body mass index (BMI) 32.0-32.9, adult: Secondary | ICD-10-CM | POA: Diagnosis not present

## 2022-08-24 DIAGNOSIS — F419 Anxiety disorder, unspecified: Secondary | ICD-10-CM | POA: Diagnosis not present

## 2022-08-24 DIAGNOSIS — G2401 Drug induced subacute dyskinesia: Secondary | ICD-10-CM | POA: Diagnosis not present

## 2022-08-24 DIAGNOSIS — K760 Fatty (change of) liver, not elsewhere classified: Secondary | ICD-10-CM | POA: Diagnosis not present

## 2022-08-24 DIAGNOSIS — Z79899 Other long term (current) drug therapy: Secondary | ICD-10-CM | POA: Diagnosis not present

## 2022-08-24 DIAGNOSIS — G4700 Insomnia, unspecified: Secondary | ICD-10-CM | POA: Diagnosis not present

## 2022-08-30 DIAGNOSIS — Z79899 Other long term (current) drug therapy: Secondary | ICD-10-CM | POA: Diagnosis not present

## 2022-09-21 DIAGNOSIS — Z79899 Other long term (current) drug therapy: Secondary | ICD-10-CM | POA: Diagnosis not present

## 2022-09-21 DIAGNOSIS — Z6831 Body mass index (BMI) 31.0-31.9, adult: Secondary | ICD-10-CM | POA: Diagnosis not present

## 2022-09-21 DIAGNOSIS — F411 Generalized anxiety disorder: Secondary | ICD-10-CM | POA: Diagnosis not present

## 2022-09-21 DIAGNOSIS — R7309 Other abnormal glucose: Secondary | ICD-10-CM | POA: Diagnosis not present

## 2022-09-21 DIAGNOSIS — M545 Low back pain, unspecified: Secondary | ICD-10-CM | POA: Diagnosis not present

## 2022-09-23 DIAGNOSIS — Z79899 Other long term (current) drug therapy: Secondary | ICD-10-CM | POA: Diagnosis not present

## 2022-09-29 DIAGNOSIS — Z1211 Encounter for screening for malignant neoplasm of colon: Secondary | ICD-10-CM | POA: Diagnosis not present

## 2022-09-29 DIAGNOSIS — K760 Fatty (change of) liver, not elsewhere classified: Secondary | ICD-10-CM | POA: Diagnosis not present

## 2022-10-19 DIAGNOSIS — Z6832 Body mass index (BMI) 32.0-32.9, adult: Secondary | ICD-10-CM | POA: Diagnosis not present

## 2022-10-19 DIAGNOSIS — Z23 Encounter for immunization: Secondary | ICD-10-CM | POA: Diagnosis not present

## 2022-10-19 DIAGNOSIS — G2401 Drug induced subacute dyskinesia: Secondary | ICD-10-CM | POA: Diagnosis not present

## 2022-10-19 DIAGNOSIS — R7309 Other abnormal glucose: Secondary | ICD-10-CM | POA: Diagnosis not present

## 2022-10-19 DIAGNOSIS — Z79899 Other long term (current) drug therapy: Secondary | ICD-10-CM | POA: Diagnosis not present

## 2022-10-19 DIAGNOSIS — F411 Generalized anxiety disorder: Secondary | ICD-10-CM | POA: Diagnosis not present

## 2022-10-19 DIAGNOSIS — M545 Low back pain, unspecified: Secondary | ICD-10-CM | POA: Diagnosis not present

## 2022-10-21 DIAGNOSIS — Z79899 Other long term (current) drug therapy: Secondary | ICD-10-CM | POA: Diagnosis not present

## 2023-04-30 ENCOUNTER — Other Ambulatory Visit: Payer: Self-pay | Admitting: Acute Care

## 2023-04-30 DIAGNOSIS — F17210 Nicotine dependence, cigarettes, uncomplicated: Secondary | ICD-10-CM

## 2023-04-30 DIAGNOSIS — Z122 Encounter for screening for malignant neoplasm of respiratory organs: Secondary | ICD-10-CM

## 2023-04-30 DIAGNOSIS — Z87891 Personal history of nicotine dependence: Secondary | ICD-10-CM

## 2023-05-16 ENCOUNTER — Ambulatory Visit: Payer: Medicare HMO

## 2023-05-16 DIAGNOSIS — Z87891 Personal history of nicotine dependence: Secondary | ICD-10-CM

## 2023-05-16 DIAGNOSIS — Z122 Encounter for screening for malignant neoplasm of respiratory organs: Secondary | ICD-10-CM

## 2023-05-16 DIAGNOSIS — F17210 Nicotine dependence, cigarettes, uncomplicated: Secondary | ICD-10-CM

## 2023-05-24 ENCOUNTER — Other Ambulatory Visit: Payer: Self-pay

## 2023-05-24 DIAGNOSIS — Z87891 Personal history of nicotine dependence: Secondary | ICD-10-CM

## 2023-05-24 DIAGNOSIS — Z122 Encounter for screening for malignant neoplasm of respiratory organs: Secondary | ICD-10-CM

## 2023-05-24 DIAGNOSIS — F17210 Nicotine dependence, cigarettes, uncomplicated: Secondary | ICD-10-CM

## 2024-04-25 ENCOUNTER — Other Ambulatory Visit: Payer: Self-pay | Admitting: Physician Assistant

## 2024-04-25 DIAGNOSIS — Z1231 Encounter for screening mammogram for malignant neoplasm of breast: Secondary | ICD-10-CM

## 2024-05-31 ENCOUNTER — Other Ambulatory Visit: Payer: Self-pay | Admitting: Acute Care

## 2024-05-31 DIAGNOSIS — Z122 Encounter for screening for malignant neoplasm of respiratory organs: Secondary | ICD-10-CM

## 2024-05-31 DIAGNOSIS — F17210 Nicotine dependence, cigarettes, uncomplicated: Secondary | ICD-10-CM

## 2024-05-31 DIAGNOSIS — Z87891 Personal history of nicotine dependence: Secondary | ICD-10-CM

## 2024-06-04 ENCOUNTER — Other Ambulatory Visit: Payer: Self-pay | Admitting: Physician Assistant

## 2024-06-04 DIAGNOSIS — Z1231 Encounter for screening mammogram for malignant neoplasm of breast: Secondary | ICD-10-CM

## 2024-06-06 ENCOUNTER — Telehealth: Payer: Self-pay

## 2024-06-06 NOTE — Telephone Encounter (Addendum)
 Spoke with patient at the request of Groce, NP. Pt completed a LDCT with [facility], arranged by PCP. No pulmonary nodules per report. Pt will complete annual scan agai nnext year due 05/25/2025, reminder set. Appt for 06/11/2024 cancelled.

## 2024-06-08 ENCOUNTER — Ambulatory Visit
Admission: RE | Admit: 2024-06-08 | Discharge: 2024-06-08 | Disposition: A | Discharge status: Home / Self Care | Source: Ambulatory Visit

## 2024-06-08 DIAGNOSIS — Z1231 Encounter for screening mammogram for malignant neoplasm of breast: Secondary | ICD-10-CM

## 2024-06-11 ENCOUNTER — Ambulatory Visit

## 2024-06-22 ENCOUNTER — Ambulatory Visit: Attending: Cardiology | Admitting: Cardiology

## 2024-06-22 ENCOUNTER — Encounter: Payer: Self-pay | Admitting: Cardiology

## 2024-06-22 VITALS — BP 120/86 | HR 107 | Ht 65.0 in | Wt 216.0 lb

## 2024-06-22 DIAGNOSIS — R0681 Apnea, not elsewhere classified: Secondary | ICD-10-CM

## 2024-06-22 DIAGNOSIS — I4892 Unspecified atrial flutter: Secondary | ICD-10-CM

## 2024-06-22 DIAGNOSIS — I480 Paroxysmal atrial fibrillation: Secondary | ICD-10-CM

## 2024-06-22 DIAGNOSIS — R0683 Snoring: Secondary | ICD-10-CM

## 2024-06-22 NOTE — Patient Instructions (Signed)
 Medication Instructions:  Your physician recommends that you continue on your current medications as directed. Please refer to the Current Medication list given to you today.  *If you need a refill on your cardiac medications before your next appointment, please call your pharmacy*   Lab Work: Pre procedure labs -- we will call you to schedule:  BMP & CBC  If you have a lab test that is abnormal and we need to change your treatment, we will call you to review the results -- otherwise no news is good news.    Testing/Procedures: Your physician has recommended that you have a sleep study. This test records several body functions during sleep, including: brain activity, eye movement, oxygen and carbon dioxide blood levels, heart rate and rhythm, breathing rate and rhythm, the flow of air through your mouth and nose, snoring, body muscle movements, and chest and belly movement.  Your physician has requested that you have cardiac CT 3 weeks PRIOR to your ablation. Cardiac computed tomography (CT) is a painless test that uses an x-ray machine to take clear, detailed pictures of your heart. We will contact you if the result is abnormal. We will call you to schedule.  Your physician has recommended that you have an ablation. Catheter ablation is a medical procedure used to treat some cardiac arrhythmias (irregular heartbeats). During catheter ablation, a long, thin, flexible tube is put into a blood vessel in your groin (upper thigh), or neck. This tube is called an ablation catheter. It is then guided to your heart through the blood vessel. Radio frequency waves destroy small areas of heart tissue where abnormal heartbeats may cause an arrhythmia to start.   The office will call you to schedule this for October, once that schedule is opened.    Follow-Up: At [facility], you and your health needs are our priority.  As part of our continuing mission to provide you with exceptional heart care, we  have created designated Provider Care Teams.  These Care Teams include your primary Cardiologist (physician) and Advanced Practice Providers (APPs -  Physician Assistants and Nurse Practitioners) who all work together to provide you with the care you need, when you need it.  Your next appointment:   1 month(s) after your ablation  The format for your next appointment:   In Person  Provider:   AFib clinic   Thank you for choosing [facility]!!   Maeola Domino, RN [phone]    Other Instructions   Cardiac Ablation Cardiac ablation is a procedure to destroy (ablate) some heart tissue that is sending bad signals. These bad signals cause problems in heart rhythm. The heart has many areas that make these signals. If there are problems in these areas, they can make the heart beat in a way that is not normal. Destroying some tissues can help make the heart rhythm normal. Tell your doctor about: Any allergies you have. All medicines you are taking. These include vitamins, herbs, eye drops, creams, and over-the-counter medicines. Any problems you or family members have had with medicines that make you fall asleep (anesthetics). Any blood disorders you have. Any surgeries you have had. Any medical conditions you have, such as kidney failure. Whether you are pregnant or may be pregnant. What are the risks? This is a safe procedure. But problems may occur, including: Infection. Bruising and bleeding. Bleeding into the chest. Stroke or blood clots. Damage to nearby areas of your body. Allergies to medicines or dyes. The need for a  pacemaker if the normal system is damaged. Failure of the procedure to treat the problem. What happens before the procedure? Medicines Ask your doctor about: Changing or stopping your normal medicines. This is important. Taking aspirin and ibuprofen . Do not take these medicines unless your doctor tells you to take them. Taking other medicines,  vitamins, herbs, and supplements. General instructions Follow instructions from your doctor about what you cannot eat or drink. Plan to have someone take you home from the hospital or clinic. If you will be going home right after the procedure, plan to have someone with you for 24 hours. Ask your doctor what steps will be taken to prevent infection. What happens during the procedure?  An IV tube will be put into one of your veins. You will be given a medicine to help you relax. The skin on your neck or groin will be numbed. A cut (incision) will be made in your neck or groin. A needle will be put through your cut and into a large vein. A tube (catheter) will be put into the needle. The tube will be moved to your heart. Dye may be put through the tube. This helps your doctor see your heart. Small devices (electrodes) on the tube will send out signals. A type of energy will be used to destroy some heart tissue. The tube will be taken out. Pressure will be held on your cut. This helps stop bleeding. A bandage will be put over your cut. The exact procedure may vary among doctors and hospitals. What happens after the procedure? You will be watched until you leave the hospital or clinic. This includes checking your heart rate, breathing rate, oxygen, and blood pressure. Your cut will be watched for bleeding. You will need to lie still for a few hours. Do not drive for 24 hours or as long as your doctor tells you. Summary Cardiac ablation is a procedure to destroy some heart tissue. This is done to treat heart rhythm problems. Tell your doctor about any medical conditions you may have. Tell him or her about all medicines you are taking to treat them. This is a safe procedure. But problems may occur. These include infection, bruising, bleeding, and damage to nearby areas of your body. Follow what your doctor tells you about food and drink. You may also be told to change or stop some of your  medicines. After the procedure, do not drive for 24 hours or as long as your doctor tells you. This information is not intended to replace advice given to you by your health care provider. Make sure you discuss any questions you have with your health care provider. Document Revised: 02/19/2022 Document Reviewed: 11/01/2019 Elsevier Patient Education  2023 Elsevier Inc.   Cardiac Ablation, Care After  This sheet gives you information about how to care for yourself after your procedure. Your health care provider may also give you more specific instructions. If you have problems or questions, contact your health care provider. What can I expect after the procedure? After the procedure, it is common to have: Bruising around your puncture site. Tenderness around your puncture site. Skipped heartbeats. If you had an atrial fibrillation ablation, you may have atrial fibrillation during the first several months after your procedure.  Tiredness (fatigue).  Follow these instructions at home: Puncture site care  Follow instructions from your health care provider about how to take care of your puncture site. Make sure you: If present, leave stitches (sutures), skin glue, or adhesive  strips in place. These skin closures may need to stay in place for up to 2 weeks. If adhesive strip edges start to loosen and curl up, you may trim the loose edges. Do not remove adhesive strips completely unless your health care provider tells you to do that. If a large square bandage is present, this may be removed 24 hours after surgery.  Check your puncture site every day for signs of infection. Check for: Redness, swelling, or pain. Fluid or blood. If your puncture site starts to bleed, lie down on your back, apply firm pressure to the area, and contact your health care provider. Warmth. Pus or a bad smell. A pea or small marble sized lump at the site is normal and can take up to three months to resolve.   Driving Do not drive for at least 4 days after your procedure or however long your health care provider recommends. (Do not resume driving if you have previously been instructed not to drive for other health reasons.) Do not drive or use heavy machinery while taking prescription pain medicine. Activity Avoid activities that take a lot of effort for at least 7 days after your procedure. Do not lift anything that is heavier than 5 lb (4.5 kg) for one week.  No sexual activity for 1 week.  Return to your normal activities as told by your health care provider. Ask your health care provider what activities are safe for you. General instructions Take over-the-counter and prescription medicines only as told by your health care provider. Do not use any products that contain nicotine or tobacco, such as cigarettes and e-cigarettes. If you need help quitting, ask your health care provider. You may shower after 24 hours, but Do not take baths, swim, or use a hot tub for 1 week.  Do not drink alcohol for 24 hours after your procedure. Keep all follow-up visits as told by your health care provider. This is important. Contact a health care provider if: You have redness, mild swelling, or pain around your puncture site. You have fluid or blood coming from your puncture site that stops after applying firm pressure to the area. Your puncture site feels warm to the touch. You have pus or a bad smell coming from your puncture site. You have a fever. You have chest pain or discomfort that spreads to your neck, jaw, or arm. You have chest pain that is worse with lying on your back or taking a deep breath. You are sweating a lot. You feel nauseous. You have a fast or irregular heartbeat. You have shortness of breath. You are dizzy or light-headed and feel the need to lie down. You have pain or numbness in the arm or leg closest to your puncture site. Get help right away if: Your puncture site suddenly  swells. Your puncture site is bleeding and the bleeding does not stop after applying firm pressure to the area. These symptoms may represent a serious problem that is an emergency. Do not wait to see if the symptoms will go away. Get medical help right away. Call your local emergency services (911 in the [place] Do not drive yourself to the hospital. Summary After the procedure, it is normal to have bruising and tenderness at the puncture site in your groin, neck, or forearm. Check your puncture site every day for signs of infection. Get help right away if your puncture site is bleeding and the bleeding does not stop after applying firm pressure to the area. This  is a medical emergency. This information is not intended to replace advice given to you by your health care provider. Make sure you discuss any questions you have with your health care provider.

## 2024-06-22 NOTE — Progress Notes (Signed)
 Electrophysiology Office Note:   Date:  06/22/2024  ID:  Michele Townsend, DOB [birth year], MRN [id number]  Primary Cardiologist: None Primary Heart Failure: None Electrophysiologist: Maryjo Ragon Gladis Norton, MD      History of Present Illness:   Michele Townsend is a 63 y.o. female with h/o atrial fibrillation, hypertension seen today for  for Electrophysiology evaluation of atrial fibrillation at the request of Norleen Blare.  Atrial fibrillation was diagnosed on cardiac monitoring 03/09/2024.  She was found to have a 1.7% atrial fibrillation burden and a 0.1% atrial flutter burden.  She had an echo with a normal ejection fraction of 65 to 70% with grade 1 diastolic dysfunction.   Today, denies symptoms of palpitations, chest pain, dyspnea, orthopnea, PND, lower extremity edema, claudication, dizziness, presyncope, syncope, bleeding, or neurologic sequela. The patient is tolerating medications without difficulties.  When she is in atrial fibrillation, she feels significant palpitations, fatigue, shortness of breath.  She feels this most often at night when she is laying down.  When she is active, she is minimally symptomatic.  She does continue to smoke, and is trying to come up with a plan to quit.  Review of systems complete and found to be negative unless listed in HPI.   EP Information / Studies Reviewed:    EKG is ordered today. Personal review as below.  EKG Interpretation Date/Time:  Friday June 22 2024 12:17:29 EDT Ventricular Rate:  107 PR Interval:  164 QRS Duration:  88 QT Interval:  340 QTC Calculation: 453 R Axis:   122  Text Interpretation: Sinus tachycardia Left posterior fascicular block Cannot rule out Anterior infarct (cited on or before 15-Oct-2015) When compared with ECG of 15-Oct-2015 19:53, Left posterior fascicular block is now Present Confirmed by Nikyah Lackman ([id number]) on 06/22/2024 12:28:51 PM    Risk Assessment/Calculations:    CHA2DS2-VASc Score = 2   This  indicates a 2.2% annual risk of stroke. The patient's score is based upon: CHF History: 0 HTN History: 1 Diabetes History: 0 Stroke History: 0 Vascular Disease History: 0 Age Score: 0 Gender Score: 1        STOP-Bang Score:  6       Physical Exam:   VS:  BP 120/86 (BP Location: Right Arm, Patient Position: Sitting, Cuff Size: Large)   Pulse (!) 107   Ht 5' 5 (1.651 m)   Wt 216 lb (98 kg)   SpO2 96%   BMI 35.94 kg/m    Wt Readings from Last 3 Encounters:  06/22/24 216 lb (98 kg)  05/16/23 200 lb (90.7 kg)  05/13/21 226 lb 6.4 oz (102.7 kg)     GEN: Well nourished, well developed in no acute distress NECK: No JVD; No carotid bruits CARDIAC: Regular rate and rhythm, no murmurs, rubs, gallops RESPIRATORY:  Clear to auscultation without rales, wheezing or rhonchi  ABDOMEN: Soft, non-tender, non-distended EXTREMITIES:  No edema; No deformity   ASSESSMENT AND PLAN:    1.  Paroxysmal atrial fibrillation/flutter: Has significant symptoms during her arrhythmia of palpitations, fatigue, shortness of breath.  Her burden is low, but she would prefer a rhythm control strategy at this time.  She would like to avoid long-term antiarrhythmics.  She is on propafenone without much improvement in her symptoms.  Jenefer Woerner start Eliquis today, and plan for ablation.  Risk, benefits, and alternatives to EP study and radiofrequency/pulse field ablation for afib were also discussed in detail today. These risks include but are not limited to stroke, bleeding,  vascular damage, tamponade, perforation, damage to the esophagus, lungs, and other structures, pulmonary vein stenosis, worsening renal function, and death. The patient understands these risk and wishes to proceed.  We Mistey Hoffert therefore proceed with catheter ablation at the next available time.  Carto, ICE, anesthesia are requested for the procedure.  Gaspare Netzel also obtain CT PV protocol prior to the procedure to exclude LAA thrombus and further evaluate  atrial anatomy.  2.  Hypertension: Well-controlled  3.  Snoring/witnessed apnea: Lycia Sachdeva plan for sleep study  4.  Obesity: Lifestyle modification encouraged.  Follow up with Afib Clinic as usual post procedure  Signed, Kashauna Celmer Gladis Norton, MD

## 2024-06-25 ENCOUNTER — Telehealth: Payer: Self-pay | Admitting: Cardiology

## 2024-06-25 NOTE — Telephone Encounter (Signed)
 Followed up with pt, she wanted to confirm that EKG she had done looked ok.  Patient informed of EKG findings/MD confirmed. She also states that Dr. Jeronimo wants her to get a sleep study as well.  She would like to have the home sleep study, not the in lab one like before.   Pt aware this should be ok, but to confirm with Dr Jeronimo in case he wanted her to repeat an in lab study.  Explained we are ok for her to undergo either one. Patient verbalized understanding and agreeable to plan.

## 2024-06-25 NOTE — Telephone Encounter (Signed)
 Patient is requesting to speak with RN Sherri in regard to completing a sleep studies test. Please advise.

## 2024-07-12 ENCOUNTER — Telehealth: Payer: Self-pay | Admitting: Cardiology

## 2024-07-12 NOTE — Telephone Encounter (Signed)
Pt is wanting to speak with you.

## 2024-07-12 NOTE — Telephone Encounter (Signed)
 Patient wants a call back from RN Sherri regarding sleep study.

## 2024-07-12 NOTE — Telephone Encounter (Signed)
 Pt reports that she recently saw Dr. Jeronimo and he recommended she get hernia repair first, especially with its location.  She is going to talk with that surgeon next week, 8/5.  She wants to stay on the list for now and can adjust date of ablation if needed based on hernia repair timing.  She will let us  know.  She appreciates my return call.

## 2024-07-18 ENCOUNTER — Other Ambulatory Visit: Payer: Self-pay

## 2024-07-18 ENCOUNTER — Telehealth: Payer: Self-pay

## 2024-07-18 DIAGNOSIS — I480 Paroxysmal atrial fibrillation: Secondary | ICD-10-CM

## 2024-07-18 NOTE — Telephone Encounter (Signed)
 Spoke with patient. Michele Townsend confirmed patient had a LDCT Lung Screening on 05/24/2024 and she is not due until 05/25/2025. Previous report was negative, no nodules. Appt cancelled.

## 2024-07-18 NOTE — Telephone Encounter (Signed)
 Spoke with the patient and scheduled her for an ablation with Dr.Camnitz on 10/28.  She is scheduled for hernia surgery on 9/15. She is not currently on a blood thinner.

## 2024-07-18 NOTE — Telephone Encounter (Signed)
 Spoke with patient. Advises she was confused about scan at time of call. States she gave me results from 05/2023. Pt  has been

## 2024-07-19 NOTE — Telephone Encounter (Signed)
 Followed up with pt.   She understands we are going to be looking for the labs her surgeon needs.  Aware we would not draw blood work before her hernia Careers adviser does.  Explained they may be getting different blood work than we need for her ablation, so get what she needs with them and we will determine if any is needed to dose OAC for ablation.  Patient verbalized understanding and agreeable to plan.

## 2024-07-19 NOTE — Telephone Encounter (Signed)
 Followed up about needed blood work to dose Eliquis.  Patient is having hernia surgery 9/15, she will get blood work for that procedure, but is not sure when as they have not scheduled blood work yet.  Aware I will follow up in several weeks, after I return from vacation, and see if/when blood work is scheduled.  Patient verbalized understanding and agreeable to plan.

## 2024-07-19 NOTE — Telephone Encounter (Signed)
 Pt requesting a c/b from the nurse

## 2024-07-23 ENCOUNTER — Ambulatory Visit

## 2024-08-09 NOTE — Telephone Encounter (Signed)
 Pt still does not have a lab date for hernia surgery.  Aware I will follow up later about getting ablation lab work.  Forwarding to MD for clarification of pre ablation CT need.  My paper has CT marked through and your note says CT.   Please clarify so we may order if it is needed .

## 2024-08-30 ENCOUNTER — Telehealth: Payer: Self-pay

## 2024-08-30 NOTE — Telephone Encounter (Signed)
 Ordering provider: Dr. Inocencio Associated diagnoses:  R06.83 (Snores) R06.81 (Apnea) WatchPAT PA obtained on 08/30/2024 by Dena JAYSON Hesselbach, CMA. Authorization: No Prior Authorization is required per [facility]. Patient notified of PIN ([id number]) on 08/30/2024 via Notification Method: phone.  Phone note routed to covering staff for follow-up.

## 2024-08-30 NOTE — Telephone Encounter (Signed)
 Pt made aware she will not need a CT prior to ablation. Aware she will need to start a blood thinner soon.  She just had surgery earlier this week, she will call surgeon to find out when it is safe for her to start OAC. She will let me know soon.

## 2024-08-31 ENCOUNTER — Encounter: Payer: Self-pay | Admitting: Cardiology

## 2024-08-31 NOTE — Telephone Encounter (Signed)
 Pt calling back to speak with you about blood thinner.

## 2024-08-31 NOTE — Telephone Encounter (Signed)
 Returned pt call.  She informs me that Dr. Joleen office told her she can start the blood thinner 3 weeks prior to the ablation procedure (advised her she will start Elqiuis 10/5 and I will be in touch soon to advise/order) Aware we will be in touch with instructions.  April - I will call her to begin the OAC

## 2024-09-03 NOTE — Telephone Encounter (Signed)
 Called and spoke with patient. Patient states she had hiatal hernia surgery last week and probably will not be able to pick up her watchpat device until the end of this week or next week. Patient wants to make sure she will still be able to pick up device at that time. Advised patient this note would be routed to the sleep team to make them aware of when she can come to get machine. Patient verbalized understanding and agreeable to plan.

## 2024-09-03 NOTE — Telephone Encounter (Signed)
 Patient called stating she will come and pick up sleep study machine later this week.

## 2024-09-04 NOTE — Telephone Encounter (Signed)
**Note De-Identified Michele Townsend Obfuscation** I called the pt back and advised her that it is ok for her to come by the office to pick up her WatchPAT One-HST device with instructions later this week or even into next week as I understand that she is not feeling well this soon after her hiatal hernia surgery last week.  I did advise her that I will not be in the office this Thursday 9/25 or next Wednesday 10/1 but that I will be here on all other days so it is ok for her stop by anytime other than those 2 days.  She verbalized understanding and thanked me for calling her back.

## 2024-09-11 NOTE — Telephone Encounter (Signed)
**Note De-Identified Rasool Rommel Obfuscation** Patient agreement reviewed and signed on 09/11/2024.  WatchPAT issued to patient on 09/11/2024 by Brice Potteiger, Avelina HERO, LPN. Patient aware to not open the WatchPAT box until contacted with the activation PIN. Patient profile initialized in CloudPAT on 09/11/2024 by Macario Julez Huseby, LPN. Device serial number: [id number]  Please list Reason for Call as Advice Only and type WatchPAT issued to patient in the comment box.

## 2024-09-12 ENCOUNTER — Telehealth: Payer: Self-pay | Admitting: Cardiology

## 2024-09-12 ENCOUNTER — Other Ambulatory Visit (HOSPITAL_BASED_OUTPATIENT_CLINIC_OR_DEPARTMENT_OTHER): Payer: Self-pay

## 2024-09-12 LAB — CBC
Hematocrit: 36.8 % (ref 34.0–46.6)
Hemoglobin: 11.3 g/dL (ref 11.1–15.9)
MCH: 26.5 pg — ABNORMAL LOW (ref 26.6–33.0)
MCHC: 30.7 g/dL — ABNORMAL LOW (ref 31.5–35.7)
MCV: 86 fL (ref 79–97)
Platelets: 301 x10E3/uL (ref 150–450)
RBC: 4.27 x10E6/uL (ref 3.77–5.28)
RDW: 13.9 % (ref 11.7–15.4)
WBC: 7.7 x10E3/uL (ref 3.4–10.8)

## 2024-09-12 LAB — BASIC METABOLIC PANEL WITH GFR
BUN/Creatinine Ratio: 21 (ref 12–28)
BUN: 18 mg/dL (ref 8–27)
CO2: 21 mmol/L (ref 20–29)
Calcium: 9.5 mg/dL (ref 8.7–10.3)
Chloride: 105 mmol/L (ref 96–106)
Creatinine, Ser: 0.85 mg/dL (ref 0.57–1.00)
Glucose: 89 mg/dL (ref 70–99)
Potassium: 4.3 mmol/L (ref 3.5–5.2)
Sodium: 142 mmol/L (ref 134–144)
eGFR: 77 mL/min/1.73 (ref >59)

## 2024-09-12 MED ORDER — APIXABAN 5 MG PO TABS
5.0000 mg | ORAL_TABLET | Freq: Two times a day (BID) | ORAL | 6 refills | Status: AC
  Filled 2024-09-12: qty 60, 30d supply, fill #0
  Filled 2024-12-02: qty 60, 30d supply, fill #1
  Filled 2025-01-01: qty 60, 30d supply, fill #2

## 2024-09-12 MED ORDER — APIXABAN 5 MG PO TABS
5.0000 mg | ORAL_TABLET | Freq: Two times a day (BID) | ORAL | 0 refills | Status: DC
  Filled 2024-09-12 – 2024-11-07 (×3): qty 60, 30d supply, fill #0

## 2024-09-12 NOTE — Telephone Encounter (Signed)
 Patient is following up. She wanted to let Sherri, RN know that she plans to stay overnight at the hospital for procedure.

## 2024-09-12 NOTE — Telephone Encounter (Signed)
 Pt calling in regards to her upcoming ablation on 10/09/24. Pt states she was told she would be prescribed a blood thinner to start 3 weeks prior to procedure, but has not yet heard anything back. Please advise

## 2024-09-12 NOTE — Telephone Encounter (Signed)
 Pt advised to start Eliquis by Friday.   Rx sent to [facility] for pt to pick up tomorrow when she is in that area.   Pt agreeable to plan.

## 2024-09-13 ENCOUNTER — Encounter: Payer: Self-pay | Admitting: Cardiology

## 2024-09-13 NOTE — Telephone Encounter (Signed)
Hospital made aware.

## 2024-09-17 ENCOUNTER — Encounter: Payer: Self-pay | Admitting: Cardiology

## 2024-09-18 ENCOUNTER — Telehealth: Payer: Self-pay | Admitting: Cardiology

## 2024-09-18 NOTE — Telephone Encounter (Signed)
 Spoke with pt and moved her procedure out to 1/2 at 12:30 pm. I scheduled her to see Daphne Barrack on 12/17 and she will get labs that day.   I explained to pt that Maeola is off today but I will have her call her tomorrow to discuss if she can stop Eliquis and start back 4 weeks prior to her procedure date. She will continue taking it until she speaks with Sherri.

## 2024-09-18 NOTE — Telephone Encounter (Signed)
 Patient states she cannot  due Ablation on 10 /28/25.  She stated that Monday and Tuesday are not not good days to reschedule.   She also wants to know if she continues  taking Eliquis.  RN inform patient to continue Eliquis until  further notice  and the scheduling   will contact you to reschedule after it is discuss with Dr Inocencio and nurse.  Patient verbalized understanding.

## 2024-09-18 NOTE — Telephone Encounter (Signed)
 Patient sent another  message  about cancelling , please refer to telephone call 09/18/24 Encounter closed.

## 2024-09-18 NOTE — Telephone Encounter (Signed)
 Pt needs to r/s ablation because she doesn't have transportation

## 2024-09-19 ENCOUNTER — Encounter: Payer: Self-pay | Admitting: Cardiology

## 2024-09-25 NOTE — Telephone Encounter (Signed)
 Dr. Inocencio, you may have verbally answered this, but I am unsure.  Please advise.[email or web address]

## 2024-10-01 NOTE — Telephone Encounter (Signed)
 Pt made aware she can stop OAC, per MD.  Myles to restart 1 month prior to ablation in January.  Patient verbalized understanding and agreeable to plan.

## 2024-10-01 NOTE — Telephone Encounter (Signed)
 Left message to call back.

## 2024-10-11 ENCOUNTER — Telehealth: Payer: Self-pay | Admitting: Cardiology

## 2024-10-11 NOTE — Telephone Encounter (Signed)
 Patient says she has a few questions for Sherri, RN regarding upcoming ablation, 12/14/24. She says she will wait until she hears from RN to discuss.

## 2024-10-12 NOTE — Telephone Encounter (Signed)
 Spoke with the patient and answered her questions in regards to upcoming ablation. Patient verbalized understanding.

## 2024-10-20 ENCOUNTER — Encounter (HOSPITAL_BASED_OUTPATIENT_CLINIC_OR_DEPARTMENT_OTHER): Payer: Self-pay | Admitting: Cardiology

## 2024-10-20 DIAGNOSIS — R0683 Snoring: Secondary | ICD-10-CM

## 2024-10-21 ENCOUNTER — Encounter: Payer: Self-pay | Admitting: Cardiology

## 2024-10-23 ENCOUNTER — Ambulatory Visit: Attending: Cardiology

## 2024-10-23 DIAGNOSIS — R0681 Apnea, not elsewhere classified: Secondary | ICD-10-CM

## 2024-10-23 DIAGNOSIS — R0683 Snoring: Secondary | ICD-10-CM

## 2024-10-23 NOTE — Procedures (Signed)
    SLEEP STUDY REPORT Patient Information Study Date: 10/20/2024 Patient Name: Michele Townsend Patient ID: [id number] Birth Date: 12-10-1961 Age: 63 Gender: BMI: 36.0 (W=216 lb, H=5' 5'') Referring Physician: Soyla Norton, MD  TEST DESCRIPTION: Home sleep apnea testing was completed using the WatchPat, a Type 1 device, utilizing peripheral arterial tonometry (PAT), chest movement, actigraphy, pulse oximetry, pulse rate, body position and snore. AHI was calculated with apnea and hypopnea using valid sleep time as the denominator. RDI includes apneas, hypopneas, and RERAs. The data acquired and the scoring of sleep and all associated events were performed in accordance with the recommended standards and specifications as outlined in the AASM Manual for the Scoring of Sleep and Associated Events 2.2.0 (2015).  FINDINGS: 1. No evidence of Obstructive Sleep Apnea with AHI 4.1/hr. 2. No Central Sleep Apnea. 3. Oxygen desaturations as low as 86%. 4. Moderate to severe snoring was present. O2 sats were < 88% for 0 minutes. 5. Total sleep time was 5 hrs and 25 min. 6. 7.6% of total sleep time was spent in REM sleep. 7. Normal sleep onset latency at 24 min. 8. Shortened REM sleep onset latency at 47 min. 9. Total awakenings were 3.  DIAGNOSIS: Normal study with no significant sleep disordered breathing.  RECOMMENDATIONS: 1. Normal study with no significant sleep disordered breathing. 2. Healthy sleep recommendations include: adequate nightly sleep (normal 7-9 hrs/night), avoidance of caffeine after noon and alcohol near bedtime, and maintaining a sleep environment that is cool, dark and quiet. 3. Weight loss for overweight patients is recommended. 4. Snoring recommendations include: weight loss where appropriate, side sleeping, and avoidance of alcohol before bed. 5. Operation of motor vehicle or dangerous equipment must be avoided when feeling drowsy, excessively sleepy, or mentally  fatigued. 6. An ENT consultation which may be useful for specific causes of and possible treatment of bothersome snoring . 7. Weight loss may be of benefit in reducing the severity of snoring.   Signature: Wilbert Bihari, MD; [facility]; Diplomat, American Board of Sleep Medicine Electronically Signed: 10/23/2024 7:38:15 PM

## 2024-10-30 ENCOUNTER — Telehealth: Payer: Self-pay | Admitting: *Deleted

## 2024-10-30 NOTE — Telephone Encounter (Signed)
Pt calling in for sleep study results

## 2024-10-30 NOTE — Telephone Encounter (Signed)
 The patient has been notified of the result via mychart.

## 2024-10-30 NOTE — Telephone Encounter (Signed)
-----   Message from Michele Townsend sent at 10/23/2024  7:39 PM EST ----- Please let patient know that sleep study showed no significant sleep apnea.

## 2024-11-07 ENCOUNTER — Other Ambulatory Visit (HOSPITAL_BASED_OUTPATIENT_CLINIC_OR_DEPARTMENT_OTHER): Payer: Self-pay

## 2024-11-07 ENCOUNTER — Telehealth: Payer: Self-pay | Admitting: Cardiology

## 2024-11-07 NOTE — Telephone Encounter (Signed)
 Pt c/o medication issue:  1. Name of Medication:  apixaban  (ELIQUIS ) 5 MG TABS tablet      2. How are you currently taking this medication (dosage and times per day)? Unknown   3. Are you having a reaction (difficulty breathing--STAT)? No   4. What is your medication issue? Pt states she was told to start medication after Dec 1 please advise    Current pharmacy  [facility] [address], [place] - [address]

## 2024-11-07 NOTE — Telephone Encounter (Signed)
 Pt calling in regards to restarting Eliquis . Per Dr. Inocencio, pt to restart her Eliquis  prior to ablation and pt previously educated to restart 12/1. Re-educated patient Dr Inocencio orders. Pt also requesting prescription be sent over for this medication. Medication previously sent to [facility] in [facility], but had stopped taking the medication before picking up next dose. Spoke with Pharmacy in [facility] and verbalized it would be filled for the patient today. Updated the patient with this information and pt verbalized understanding this is to be restarted 12/1 twice daily.

## 2024-11-13 ENCOUNTER — Encounter: Payer: Self-pay | Admitting: Cardiology

## 2024-11-22 ENCOUNTER — Other Ambulatory Visit (HOSPITAL_COMMUNITY): Payer: Self-pay

## 2024-11-22 ENCOUNTER — Telehealth (HOSPITAL_COMMUNITY): Payer: Self-pay

## 2024-11-22 ENCOUNTER — Encounter (HOSPITAL_COMMUNITY): Payer: Self-pay

## 2024-11-22 DIAGNOSIS — Z01812 Encounter for preprocedural laboratory examination: Secondary | ICD-10-CM

## 2024-11-22 DIAGNOSIS — I480 Paroxysmal atrial fibrillation: Secondary | ICD-10-CM

## 2024-11-22 NOTE — Telephone Encounter (Signed)
 Spoke with patient to complete pre-procedure call.     Health status review:  Any new medical conditions, recent signs of acute illness or been started on antibiotics? No Any recent hospitalizations or surgeries? No Any new medications started since pre-op visit? No  Follow all medication instructions prior to procedure or the procedure may be rescheduled:    Continue taking Eliquis  (Apixaban ) twice daily without missing any doses before procedure. Essential chronic medications:  No medication should be continued, unless told otherwise.  HOLD Tirzepatide (Mounjaro, Zepbound) for 7 days prior to your procedure. Last dose on December 21.  On the morning of your procedure DO NOT take any medication., including Eliquis  (Apixaban ).  Nothing to eat or drink after midnight prior to your procedure.  Pre-procedure testing scheduled: CT not required and lab work on December 17.  Confirmed patient is scheduled for Atrial Fibrillation Ablation on Friday, January 2 with Dr. Inocencio. Instructed patient to arrive at the Main Entrance A at [facility]: [address] [place], [place] [address] and check in at Admitting at 9:30 AM.  You will be staying overnight at the hospital. Please bring necessary items with you.  You MUST have a responsible adult to drive you home and MUST be with you the first 24 hours after you arrive home or your procedure could be cancelled.  Informed a nurse may call a day before the procedure to confirm arrival time and ensure instructions are followed.  Patient verbalized understanding to information provided and is agreeable to proceed with procedure.   Advised to contact RN Navigator at [phone], to inform of any new medications started after call or concerns prior to procedure.

## 2024-11-22 NOTE — Telephone Encounter (Signed)
 Attempted to reach patient to discuss upcoming procedure. States she is driving and requested a call back later this afternoon.

## 2024-11-27 NOTE — H&P (View-Only) (Signed)
°  Electrophysiology Office Note:   Date:  11/28/2024  ID:  KARLEE STAFF, DOB 04/29/1961, MRN [id number]  Primary Cardiologist: None Primary Heart Failure: None Electrophysiologist: Will Gladis Norton, MD      History of Present Illness:   Michele Townsend is a 63 y.o. female with h/o AF, hyperparathyroidism seen today for routine electrophysiology followup.   Since last being seen in our clinic the patient reports doing well. She has quit smoking as of 3 months ago.  She is on Zepbound and has lost from 23 lbs. She notes she has fatigue and lightheadedness when she is in AF but otherwise unaware when she in / out of AF. No syncope or palpitations.     She denies chest pain, palpitations, dyspnea, PND, orthopnea, nausea, vomiting, dizziness, syncope, edema, weight gain, or early satiety.   Review of systems complete and found to be negative unless listed in HPI.   EP Information / Studies Reviewed:    EKG is ordered today. Personal review as below.  EKG Interpretation Date/Time:  Wednesday November 28 2024 10:44:26 EST Ventricular Rate:  98 PR Interval:  178 QRS Duration:  78 QT Interval:  330 QTC Calculation: 421 R Axis:   137  Text Interpretation: Normal sinus rhythm Confirmed by Aniceto Jarvis ([id number]) on 11/28/2024 10:49:19 AM    Arrhythmia / AAD / Pertinent EP Studies AF  Cardiac Monitor 03/09/24 > 1.7% AF burden, 0.1% AFL  Propafenone   Risk Assessment/Calculations:    CHA2DS2-VASc Score = 2   This indicates a 2.2% annual risk of stroke. The patient's score is based upon: CHF History: 0 HTN History: 1 Diabetes History: 0 Stroke History: 0 Vascular Disease History: 0 Age Score: 0 Gender Score: 1         STOP-Bang Score:  6       Physical Exam:   VS:  BP 124/60   Pulse 98   Ht 5' 5 (1.651 m)   Wt 203 lb (92.1 kg)   SpO2 96%   BMI 33.78 kg/m    Wt Readings from Last 3 Encounters:  11/28/24 203 lb (92.1 kg)  06/22/24 216 lb (98 kg)  05/16/23 200 lb  (90.7 kg)     GEN: Well nourished, well developed in no acute distress NECK: No JVD; No carotid bruits CARDIAC: Regular rate and rhythm, no murmurs, rubs, gallops RESPIRATORY:  Clear to auscultation without rales, wheezing or rhonchi  ABDOMEN: Soft, non-tender, non-distended EXTREMITIES:  No edema; No deformity   ASSESSMENT AND PLAN:    Paroxysmal Atrial Fibrillation  Atrial Flutter  CHA2DS2-VASc 2  -pending ablation on 12/15/23 with Dr. Norton  -pre-procedure labs > BMP, CBC   -procedure discussed in detail, written instructions given to patient   -continue OAC for stroke prophylaxis  -completed sleep study with no OSA   Secondary Hypercoagulable State  -continue Eliquis  5mg  BID, dose reviewed and appropriate by age / wt   Hypertension  -well controlled on current regimen   Obesity  -on Zepbound, down 23lbs   Former Smoker -quit 3 months ago > ~08/2024.  Encouraged patient to continue cessation.    Follow up with Dr. Norton as planned on 12/15/23 for ablation    Signed, Jarvis Aniceto, NP-C, AGACNP-BC [facility] - Electrophysiology  11/28/2024, 10:49 AM

## 2024-11-27 NOTE — Progress Notes (Unsigned)
°  Electrophysiology Office Note:   Date:  11/28/2024  ID:  Michele Townsend, DOB 04/29/1961, MRN [id number]  Primary Cardiologist: None Primary Heart Failure: None Electrophysiologist: Will Gladis Norton, MD      History of Present Illness:   Michele Townsend is a 63 y.o. female with h/o AF, hyperparathyroidism seen today for routine electrophysiology followup.   Since last being seen in our clinic the patient reports doing well. She has quit smoking as of 3 months ago.  She is on Zepbound and has lost from 23 lbs. She notes she has fatigue and lightheadedness when she is in AF but otherwise unaware when she in / out of AF. No syncope or palpitations.     She denies chest pain, palpitations, dyspnea, PND, orthopnea, nausea, vomiting, dizziness, syncope, edema, weight gain, or early satiety.   Review of systems complete and found to be negative unless listed in HPI.   EP Information / Studies Reviewed:    EKG is ordered today. Personal review as below.  EKG Interpretation Date/Time:  Wednesday November 28 2024 10:44:26 EST Ventricular Rate:  98 PR Interval:  178 QRS Duration:  78 QT Interval:  330 QTC Calculation: 421 R Axis:   137  Text Interpretation: Normal sinus rhythm Confirmed by Aniceto Jarvis ([id number]) on 11/28/2024 10:49:19 AM    Arrhythmia / AAD / Pertinent EP Studies AF  Cardiac Monitor 03/09/24 > 1.7% AF burden, 0.1% AFL  Propafenone   Risk Assessment/Calculations:    CHA2DS2-VASc Score = 2   This indicates a 2.2% annual risk of stroke. The patient's score is based upon: CHF History: 0 HTN History: 1 Diabetes History: 0 Stroke History: 0 Vascular Disease History: 0 Age Score: 0 Gender Score: 1         STOP-Bang Score:  6       Physical Exam:   VS:  BP 124/60   Pulse 98   Ht 5' 5 (1.651 m)   Wt 203 lb (92.1 kg)   SpO2 96%   BMI 33.78 kg/m    Wt Readings from Last 3 Encounters:  11/28/24 203 lb (92.1 kg)  06/22/24 216 lb (98 kg)  05/16/23 200 lb  (90.7 kg)     GEN: Well nourished, well developed in no acute distress NECK: No JVD; No carotid bruits CARDIAC: Regular rate and rhythm, no murmurs, rubs, gallops RESPIRATORY:  Clear to auscultation without rales, wheezing or rhonchi  ABDOMEN: Soft, non-tender, non-distended EXTREMITIES:  No edema; No deformity   ASSESSMENT AND PLAN:    Paroxysmal Atrial Fibrillation  Atrial Flutter  CHA2DS2-VASc 2  -pending ablation on 12/15/23 with Dr. Norton  -pre-procedure labs > BMP, CBC   -procedure discussed in detail, written instructions given to patient   -continue OAC for stroke prophylaxis  -completed sleep study with no OSA   Secondary Hypercoagulable State  -continue Eliquis  5mg  BID, dose reviewed and appropriate by age / wt   Hypertension  -well controlled on current regimen   Obesity  -on Zepbound, down 23lbs   Former Smoker -quit 3 months ago > ~08/2024.  Encouraged patient to continue cessation.    Follow up with Dr. Norton as planned on 12/15/23 for ablation    Signed, Jarvis Aniceto, NP-C, AGACNP-BC [facility] - Electrophysiology  11/28/2024, 10:49 AM

## 2024-11-28 ENCOUNTER — Encounter: Payer: Self-pay | Admitting: Pulmonary Disease

## 2024-11-28 ENCOUNTER — Ambulatory Visit: Admitting: Pulmonary Disease

## 2024-11-28 VITALS — BP 124/60 | HR 98 | Ht 65.0 in | Wt 203.0 lb

## 2024-11-28 DIAGNOSIS — D6869 Other thrombophilia: Secondary | ICD-10-CM | POA: Diagnosis not present

## 2024-11-28 DIAGNOSIS — I4892 Unspecified atrial flutter: Secondary | ICD-10-CM | POA: Diagnosis not present

## 2024-11-28 DIAGNOSIS — I480 Paroxysmal atrial fibrillation: Secondary | ICD-10-CM

## 2024-11-28 LAB — CBC
Hematocrit: 42.8 % (ref 34.0–46.6)
Hemoglobin: 13 g/dL (ref 11.1–15.9)
MCH: 25.6 pg — ABNORMAL LOW (ref 26.6–33.0)
MCHC: 30.4 g/dL — ABNORMAL LOW (ref 31.5–35.7)
MCV: 84 fL (ref 79–97)
Platelets: 266 x10E3/uL (ref 150–450)
RBC: 5.07 x10E6/uL (ref 3.77–5.28)
RDW: 17.8 % — ABNORMAL HIGH (ref 11.7–15.4)
WBC: 10.7 x10E3/uL (ref 3.4–10.8)

## 2024-11-28 LAB — BASIC METABOLIC PANEL WITH GFR
BUN/Creatinine Ratio: 16 (ref 12–28)
BUN: 16 mg/dL (ref 8–27)
CO2: 22 mmol/L (ref 20–29)
Calcium: 10 mg/dL (ref 8.7–10.3)
Chloride: 104 mmol/L (ref 96–106)
Creatinine, Ser: 1.02 mg/dL — ABNORMAL HIGH (ref 0.57–1.00)
Glucose: 93 mg/dL (ref 70–99)
Potassium: 4.7 mmol/L (ref 3.5–5.2)
Sodium: 140 mmol/L (ref 134–144)
eGFR: 62 mL/min/1.73 (ref >59)

## 2024-11-28 NOTE — Patient Instructions (Signed)
 Medication Instructions:  Your physician recommends that you continue on your current medications as directed. Please refer to the Current Medication list given to you today.  *If you need a refill on your cardiac medications before your next appointment, please call your pharmacy*  Lab Work: BMET, CBC-TODAY If you have labs (blood work) drawn today and your tests are completely normal, you will receive your results only by: MyChart Message (if you have MyChart) OR A paper copy in the mail If you have any lab test that is abnormal or we need to change your treatment, we will call you to review the results.  Testing/Procedures: See letter  Follow-Up: At [facility], you and your health needs are our priority.  As part of our continuing mission to provide you with exceptional heart care, our providers are all part of one team.  This team includes your primary Cardiologist (physician) and Advanced Practice Providers or APPs (Physician Assistants and Nurse Practitioners) who all work together to provide you with the care you need, when you need it.  Your next appointment:   Follow up will be arranged for you and print out on your discharge summary after your procedure.

## 2024-11-29 ENCOUNTER — Ambulatory Visit: Payer: Self-pay | Admitting: Pulmonary Disease

## 2024-11-30 ENCOUNTER — Telehealth: Payer: Self-pay | Admitting: Cardiology

## 2024-11-30 NOTE — Telephone Encounter (Signed)
 Pt had lab work done 11/17 but keeps getting notifications to have them done. Pt requesting call back. Please advise.

## 2024-11-30 NOTE — Telephone Encounter (Signed)
 Spoke with pt and advised her labs were completed appropriately and she is receiving notifications as duplicate orders were placed.  Pt verbalized understanding and thanked Rn for the call.

## 2024-12-10 ENCOUNTER — Encounter: Payer: Self-pay | Admitting: Cardiology

## 2024-12-12 NOTE — Pre-Procedure Instructions (Addendum)
 Instructed patient on the following items: Arrival time 0930 Nothing to eat or drink after midnight No meds AM of procedure Responsible person to drive you home and stay with you for 24 hrs  Have you missed any doses of anti-coagulant Eliquis - takes twice a day, missed a dose recently.  Will do EKG on arrival with possible TEE if in afib.  Don't take dose morning of procedure.  Zepbound- last dose 12/21

## 2024-12-13 ENCOUNTER — Encounter: Payer: Self-pay | Admitting: Cardiology

## 2024-12-14 ENCOUNTER — Encounter (HOSPITAL_COMMUNITY)
Admission: RE | Disposition: A | Discharge status: Home / Self Care | Payer: Self-pay | Source: Home / Self Care | Attending: Cardiology

## 2024-12-14 ENCOUNTER — Ambulatory Visit (HOSPITAL_COMMUNITY): Admitting: Anesthesiology

## 2024-12-14 ENCOUNTER — Other Ambulatory Visit: Payer: Self-pay

## 2024-12-14 ENCOUNTER — Ambulatory Visit (HOSPITAL_COMMUNITY)
Admission: RE | Admit: 2024-12-14 | Discharge: 2024-12-15 | Disposition: A | Discharge status: Home / Self Care | Attending: Cardiology | Admitting: Cardiology

## 2024-12-14 DIAGNOSIS — I10 Essential (primary) hypertension: Secondary | ICD-10-CM

## 2024-12-14 DIAGNOSIS — Z79899 Other long term (current) drug therapy: Secondary | ICD-10-CM | POA: Diagnosis not present

## 2024-12-14 DIAGNOSIS — I480 Paroxysmal atrial fibrillation: Secondary | ICD-10-CM | POA: Diagnosis not present

## 2024-12-14 DIAGNOSIS — E213 Hyperparathyroidism, unspecified: Secondary | ICD-10-CM | POA: Diagnosis not present

## 2024-12-14 DIAGNOSIS — I4891 Unspecified atrial fibrillation: Secondary | ICD-10-CM

## 2024-12-14 DIAGNOSIS — D6869 Other thrombophilia: Secondary | ICD-10-CM | POA: Diagnosis not present

## 2024-12-14 DIAGNOSIS — I483 Typical atrial flutter: Secondary | ICD-10-CM

## 2024-12-14 DIAGNOSIS — G4730 Sleep apnea, unspecified: Secondary | ICD-10-CM | POA: Insufficient documentation

## 2024-12-14 DIAGNOSIS — Z87891 Personal history of nicotine dependence: Secondary | ICD-10-CM | POA: Insufficient documentation

## 2024-12-14 DIAGNOSIS — E669 Obesity, unspecified: Secondary | ICD-10-CM | POA: Insufficient documentation

## 2024-12-14 DIAGNOSIS — Z7901 Long term (current) use of anticoagulants: Secondary | ICD-10-CM | POA: Diagnosis not present

## 2024-12-14 DIAGNOSIS — F418 Other specified anxiety disorders: Secondary | ICD-10-CM

## 2024-12-14 DIAGNOSIS — Z6833 Body mass index (BMI) 33.0-33.9, adult: Secondary | ICD-10-CM | POA: Insufficient documentation

## 2024-12-14 DIAGNOSIS — K219 Gastro-esophageal reflux disease without esophagitis: Secondary | ICD-10-CM | POA: Insufficient documentation

## 2024-12-14 DIAGNOSIS — F17210 Nicotine dependence, cigarettes, uncomplicated: Secondary | ICD-10-CM | POA: Diagnosis not present

## 2024-12-14 HISTORY — PX: ATRIAL FIBRILLATION ABLATION: EP1191

## 2024-12-14 LAB — POCT ACTIVATED CLOTTING TIME
Activated Clotting Time: 199 s
Activated Clotting Time: 363 s

## 2024-12-14 MED ORDER — PHENYLEPHRINE 80 MCG/ML (10ML) SYRINGE FOR IV PUSH (FOR BLOOD PRESSURE SUPPORT)
PREFILLED_SYRINGE | INTRAVENOUS | Status: DC | PRN
  Administered 2024-12-14 (×3): 160 ug via INTRAVENOUS

## 2024-12-14 MED ORDER — ALBUTEROL SULFATE (2.5 MG/3ML) 0.083% IN NEBU: 2.5000 mg | INHALATION_SOLUTION | Freq: Four times a day (QID) | RESPIRATORY_TRACT | Status: DC | PRN

## 2024-12-14 MED ORDER — OXYCODONE-ACETAMINOPHEN 5-325 MG PO TABS
ORAL_TABLET | ORAL | Status: AC
  Administered 2024-12-14: 2 via ORAL
  Filled 2024-12-14: qty 2

## 2024-12-14 MED ORDER — ROCURONIUM BROMIDE 10 MG/ML (PF) SYRINGE
PREFILLED_SYRINGE | INTRAVENOUS | Status: DC | PRN
  Administered 2024-12-14: 50 mg via INTRAVENOUS

## 2024-12-14 MED ORDER — ZOLPIDEM TARTRATE 5 MG PO TABS
5.0000 mg | ORAL_TABLET | Freq: Every evening | ORAL | Status: DC | PRN
  Administered 2024-12-14: 5 mg via ORAL
  Filled 2024-12-14: qty 1

## 2024-12-14 MED ORDER — HEPARIN SODIUM (PORCINE) 1000 UNIT/ML IJ SOLN
INTRAMUSCULAR | Status: AC
  Filled 2024-12-14: qty 10

## 2024-12-14 MED ORDER — HEPARIN (PORCINE) IN NACL 1000-0.9 UT/500ML-% IV SOLN
INTRAVENOUS | Status: DC | PRN
  Administered 2024-12-14 (×2): 500 mL

## 2024-12-14 MED ORDER — LACTATED RINGERS IV SOLN: INTRAVENOUS | Status: DC | PRN

## 2024-12-14 MED ORDER — HEPARIN SODIUM (PORCINE) 1000 UNIT/ML IJ SOLN
INTRAMUSCULAR | Status: DC | PRN
  Administered 2024-12-14: 15000 [IU] via INTRAVENOUS
  Administered 2024-12-14: 3000 [IU] via INTRAVENOUS

## 2024-12-14 MED ORDER — GABAPENTIN 300 MG PO CAPS
600.0000 mg | ORAL_CAPSULE | Freq: Two times a day (BID) | ORAL | Status: DC
  Administered 2024-12-14 – 2024-12-15 (×2): 600 mg via ORAL
  Filled 2024-12-14 (×2): qty 2

## 2024-12-14 MED ORDER — SODIUM CHLORIDE 0.9 % IV SOLN: INTRAVENOUS | Status: DC

## 2024-12-14 MED ORDER — PHENYLEPHRINE HCL-NACL 20-0.9 MG/250ML-% IV SOLN
INTRAVENOUS | Status: DC | PRN
  Administered 2024-12-14: 30 ug/min via INTRAVENOUS

## 2024-12-14 MED ORDER — ALPRAZOLAM 0.5 MG PO TABS
0.5000 mg | ORAL_TABLET | Freq: Three times a day (TID) | ORAL | Status: DC | PRN
  Administered 2024-12-14: 0.5 mg via ORAL
  Filled 2024-12-14: qty 1

## 2024-12-14 MED ORDER — PROPOFOL 10 MG/ML IV BOLUS
INTRAVENOUS | Status: DC | PRN
  Administered 2024-12-14: 150 mg via INTRAVENOUS

## 2024-12-14 MED ORDER — ALBUMIN HUMAN 5 % IV SOLN: INTRAVENOUS | Status: DC | PRN

## 2024-12-14 MED ORDER — LINACLOTIDE 145 MCG PO CAPS: 145.0000 ug | ORAL_CAPSULE | Freq: Every day | ORAL | Status: DC | PRN

## 2024-12-14 MED ORDER — IRBESARTAN 300 MG PO TABS
150.0000 mg | ORAL_TABLET | Freq: Every day | ORAL | Status: DC
  Administered 2024-12-14: 150 mg via ORAL
  Filled 2024-12-14 (×2): qty 1

## 2024-12-14 MED ORDER — SODIUM CHLORIDE 0.9% FLUSH: 3.0000 mL | INTRAVENOUS | Status: DC | PRN

## 2024-12-14 MED ORDER — ATORVASTATIN CALCIUM 10 MG PO TABS
20.0000 mg | ORAL_TABLET | Freq: Every evening | ORAL | Status: DC
  Administered 2024-12-14: 20 mg via ORAL
  Filled 2024-12-14: qty 2

## 2024-12-14 MED ORDER — HYDROXYZINE HCL 25 MG PO TABS: 25.0000 mg | ORAL_TABLET | Freq: Two times a day (BID) | ORAL | Status: DC | PRN

## 2024-12-14 MED ORDER — TIRZEPATIDE-WEIGHT MANAGEMENT 5 MG/0.5ML ~~LOC~~ SOAJ: 5.0000 mg | SUBCUTANEOUS | Status: DC

## 2024-12-14 MED ORDER — PROTAMINE SULFATE 10 MG/ML IV SOLN
INTRAVENOUS | Status: DC | PRN
  Administered 2024-12-14: 40 mg via INTRAVENOUS

## 2024-12-14 MED ORDER — SODIUM CHLORIDE 0.9% FLUSH
3.0000 mL | Freq: Two times a day (BID) | INTRAVENOUS | Status: DC
  Administered 2024-12-14: 3 mL via INTRAVENOUS

## 2024-12-14 MED ORDER — ONDANSETRON HCL 4 MG/2ML IJ SOLN
INTRAMUSCULAR | Status: DC | PRN
  Administered 2024-12-14: 4 mg via INTRAVENOUS

## 2024-12-14 MED ORDER — ONDANSETRON HCL 4 MG/2ML IJ SOLN: 4.0000 mg | Freq: Four times a day (QID) | INTRAMUSCULAR | Status: DC | PRN

## 2024-12-14 MED ORDER — OXYCODONE-ACETAMINOPHEN 5-325 MG PO TABS
ORAL_TABLET | ORAL | Status: AC
  Filled 2024-12-14: qty 1

## 2024-12-14 MED ORDER — SERTRALINE HCL 50 MG PO TABS
50.0000 mg | ORAL_TABLET | Freq: Every day | ORAL | Status: DC
  Administered 2024-12-14 – 2024-12-15 (×2): 50 mg via ORAL
  Filled 2024-12-14 (×2): qty 1

## 2024-12-14 MED ORDER — DEXAMETHASONE SOD PHOSPHATE PF 10 MG/ML IJ SOLN
INTRAMUSCULAR | Status: DC | PRN
  Administered 2024-12-14: 5 mg via INTRAVENOUS

## 2024-12-14 MED ORDER — OXYCODONE-ACETAMINOPHEN 5-325 MG PO TABS
2.0000 | ORAL_TABLET | Freq: Four times a day (QID) | ORAL | Status: DC | PRN
  Administered 2024-12-15: 2 via ORAL
  Filled 2024-12-14: qty 2

## 2024-12-14 MED ORDER — ALBUTEROL SULFATE HFA 108 (90 BASE) MCG/ACT IN AERS: 1.0000 | INHALATION_SPRAY | Freq: Four times a day (QID) | RESPIRATORY_TRACT | Status: DC | PRN

## 2024-12-14 MED ORDER — FAMOTIDINE 20 MG PO TABS: 20.0000 mg | ORAL_TABLET | Freq: Every day | ORAL | Status: DC | PRN

## 2024-12-14 MED ORDER — ACETAMINOPHEN 325 MG PO TABS: 650.0000 mg | ORAL_TABLET | ORAL | Status: DC | PRN

## 2024-12-14 MED ORDER — HEPARIN SODIUM (PORCINE) 1000 UNIT/ML IJ SOLN
INTRAMUSCULAR | Status: DC | PRN
  Administered 2024-12-14: 1000 [IU] via INTRAVENOUS

## 2024-12-14 MED ORDER — LIDOCAINE 2% (20 MG/ML) 5 ML SYRINGE
INTRAMUSCULAR | Status: DC | PRN
  Administered 2024-12-14: 40 mg via INTRAVENOUS

## 2024-12-14 MED ORDER — SUGAMMADEX SODIUM 200 MG/2ML IV SOLN
INTRAVENOUS | Status: DC | PRN
  Administered 2024-12-14: 200 mg via INTRAVENOUS

## 2024-12-14 MED ORDER — DEUTETRABENAZINE ER 24 MG PO TB24: 24.0000 mg | ORAL_TABLET | Freq: Every day | ORAL | Status: DC

## 2024-12-14 MED ORDER — SODIUM CHLORIDE 0.9 % IV SOLN: 250.0000 mL | INTRAVENOUS | Status: DC | PRN

## 2024-12-14 MED ORDER — APIXABAN 5 MG PO TABS
5.0000 mg | ORAL_TABLET | Freq: Two times a day (BID) | ORAL | Status: DC
  Administered 2024-12-14 – 2024-12-15 (×2): 5 mg via ORAL
  Filled 2024-12-14 (×2): qty 1

## 2024-12-14 MED ORDER — CARIPRAZINE HCL 1.5 & 3 MG PO CPPK: 4.5000 mg | ORAL_CAPSULE | Freq: Every day | ORAL | Status: DC

## 2024-12-14 MED ORDER — PROPAFENONE HCL ER 225 MG PO CP12
225.0000 mg | ORAL_CAPSULE | Freq: Two times a day (BID) | ORAL | Status: DC
  Administered 2024-12-14 – 2024-12-15 (×2): 225 mg via ORAL
  Filled 2024-12-14 (×2): qty 1

## 2024-12-14 NOTE — Anesthesia Procedure Notes (Signed)
 Procedure Name: Intubation Date/Time: 12/14/2024 11:16 AM  Performed by: Scherrie Mast, CRNAPre-anesthesia Checklist: Patient identified, Emergency Drugs available, Suction available and Patient being monitored Patient Re-evaluated:Patient Re-evaluated prior to induction Oxygen Delivery Method: Circle System Utilized Preoxygenation: Pre-oxygenation with 100% oxygen Induction Type: IV induction Ventilation: Mask ventilation without difficulty Laryngoscope Size: Mac and 3 Grade View: Grade I Tube type: Oral Tube size: 7.0 mm Number of attempts: 1 Airway Equipment and Method: Stylet and Oral airway Placement Confirmation: ETT inserted through vocal cords under direct vision, positive ETCO2 and breath sounds checked- equal and bilateral Secured at: 21 cm Tube secured with: Tape Dental Injury: Teeth and Oropharynx as per pre-operative assessment

## 2024-12-14 NOTE — Discharge Instructions (Signed)

## 2024-12-14 NOTE — Transfer of Care (Signed)
 Immediate Anesthesia Transfer of Care Note  Patient: Michele Townsend  Procedure(s) Performed: ATRIAL FIBRILLATION ABLATION  Patient Location: Cath Lab  Anesthesia Type:General  Level of Consciousness: awake, alert , and oriented  Airway & Oxygen Therapy: Patient Spontanous Breathing and Patient connected to nasal cannula oxygen  Post-op Assessment: Report given to RN and Post -op Vital signs reviewed and stable  Post vital signs: Reviewed and stable  Last Vitals:  Vitals Value Taken Time  BP 117/68 12/14/24 12:45  Temp 36.9 C 12/14/24 12:44  Pulse 64 12/14/24 12:55  Resp 15 12/14/24 12:55  SpO2 95 % 12/14/24 12:55  Vitals shown include unfiled device data.  Last Pain:  Vitals:   12/14/24 1244  TempSrc: Tympanic  PainSc: 7          Complications: There were no known notable events for this encounter.

## 2024-12-14 NOTE — Anesthesia Preprocedure Evaluation (Addendum)
"                                    Anesthesia Evaluation   Patient awake    Reviewed: Allergy & Precautions, NPO status , Patient's Chart, lab work & pertinent test results  Airway Mallampati: I  TM Distance: >3 FB     Dental  (+) Edentulous Upper, Edentulous Lower   Pulmonary sleep apnea , Current Smoker and Patient abstained from smoking.   breath sounds clear to auscultation       Cardiovascular hypertension, Pt. on medications + dysrhythmias Atrial Fibrillation  Rhythm:Irregular Rate:Normal     Neuro/Psych  Headaches PSYCHIATRIC DISORDERS Anxiety Depression       GI/Hepatic Neg liver ROS,GERD  Medicated,,  Endo/Other  negative endocrine ROS    Renal/GU negative Renal ROS     Musculoskeletal negative musculoskeletal ROS (+)    Abdominal   Peds  Hematology negative hematology ROS (+)   Anesthesia Other Findings   Reproductive/Obstetrics                              Anesthesia Physical Anesthesia Plan  ASA: 3  Anesthesia Plan: General   Post-op Pain Management: Tylenol  PO (pre-op)*   Induction: Intravenous  PONV Risk Score and Plan: 2 and Ondansetron  and Midazolam   Airway Management Planned: Oral ETT  Additional Equipment: None  Intra-op Plan:   Post-operative Plan: Extubation in OR  Informed Consent:   Plan Discussed with: CRNA  Anesthesia Plan Comments:          Anesthesia Quick Evaluation  "

## 2024-12-14 NOTE — Interval H&P Note (Signed)
 History and Physical Interval Note:  12/14/2024 10:32 AM  Michele Townsend  has presented today for surgery, with the diagnosis of afib.  The various methods of treatment have been discussed with the patient and family. After consideration of risks, benefits and other options for treatment, the patient has consented to  Procedures: ATRIAL FIBRILLATION ABLATION (N/A) as a surgical intervention.  The patient's history has been reviewed, patient examined, no change in status, stable for surgery.  I have reviewed the patient's chart and labs.  Questions were answered to the patient's satisfaction.     Larua Collier [employer]

## 2024-12-14 NOTE — Progress Notes (Signed)
 Pt informed RN that son, Toribio, will be by tomorrow late morning/early afternoon to provide transportation home.

## 2024-12-15 ENCOUNTER — Encounter (HOSPITAL_COMMUNITY): Payer: Self-pay | Admitting: Cardiology

## 2024-12-15 DIAGNOSIS — E213 Hyperparathyroidism, unspecified: Secondary | ICD-10-CM | POA: Diagnosis not present

## 2024-12-15 DIAGNOSIS — I483 Typical atrial flutter: Secondary | ICD-10-CM | POA: Diagnosis not present

## 2024-12-15 DIAGNOSIS — I480 Paroxysmal atrial fibrillation: Secondary | ICD-10-CM | POA: Diagnosis not present

## 2024-12-15 DIAGNOSIS — D6869 Other thrombophilia: Secondary | ICD-10-CM | POA: Diagnosis not present

## 2024-12-15 NOTE — Discharge Summary (Addendum)
 "  Discharge Summary   Patient ID: Michele Townsend MRN: [id number]; DOB: 03/27/1961  Admit date: 12/14/2024 Discharge date: 12/15/2024  PCP:  Wilfrid Dedra Maus, PA-C   [facility] Providers Cardiologist:  None  Electrophysiologist:  Velvie Thomaston Gladis Norton, MD  Discharge Diagnoses  Principal Problem:   Paroxysmal atrial fibrillation [facility])   Procedures This Admission:  1. Comprehensive electrophysiologic study. 2. Coronary sinus pacing and recording. 3. Three-dimensional mapping of atrial fibrillation with additional mapping and ablation of a second discrete focus 4. Ablation of atrial fibrillation with additional mapping and ablation of a second discrete focus 5. Intracardiac echocardiography. 6. Transseptal puncture of an intact septum.  CONCLUSIONS: 1. Sinus rhythm upon presentation.   2. Successful electrical isolation and anatomical encircling of all four pulmonary veins with pulsed field ablation. 3. Posterior wall isolation using pulse field ablation 4. Cavotricuspid isthmus ablation 5. No early apparent complications.  Brief HPI: Michele Townsend is a 64 y.o. female with a history of paroxysmal atrial fibrillation and typical atrial flutter presented to the hospital for scheduled AF ablation on 1/2.    Hospital Course:  Consultants: None   The patient was admitted and underwent AF ablation with details as outlined above.  They were monitored on telemetry overnight which demonstrated no concerning arrhythmias.  Groin was without complication on the day of discharge.  The patient was examined and considered to be stable for discharge by Dr. Norton.  Wound care and restrictions were reviewed with the patient.  Follow up arrangement was made.   Physical Exam Per MD   Discharge Vitals Blood pressure (!) 95/46, pulse 71, temperature 97.9 F (36.6 C), temperature source Oral, resp. rate 16, height 5' 5 (1.651 m), weight 91.4 kg, SpO2 97%.  Filed Weights    12/14/24 0942 12/14/24 1700  Weight: 90.7 kg 91.4 kg    Labs  None this admission _____________  EP STUDY Result Date: 12/14/2024 SURGEON:  Soyla Norton, MD PREPROCEDURE DIAGNOSES: 1. Paroxysmal atrial fibrillation. 2. Typical atrial flutter POSTPROCEDURE DIAGNOSES: 1. Paroxysmal atrial fibrillation. 2. Typical atrial flutter PROCEDURES: 1. Comprehensive electrophysiologic study. 2. Coronary sinus pacing and recording. 3. Three-dimensional mapping of atrial fibrillation with additional mapping and ablation of a second discrete focus 4. Ablation of atrial fibrillation with additional mapping and ablation of a second discrete focus 5. Intracardiac echocardiography. 6. Transseptal puncture of an intact septum. INTRODUCTION:  Michele Townsend is a 64 y.o. female with a history of persistent atrial fibrillation who now presents for EP study and radiofrequency ablation.  The patient reports initially being diagnosed with atrial fibrillation after presenting with symptomatic palpitations and fatgiue. The patient reports increasing frequency and duration of atrial fibrillation since that time.  The patient has failed medical therapy.  The patient therefore presents today for catheter ablation of atrial fibrillation. DESCRIPTION OF PROCEDURE:  Informed written consent was obtained, and the patient was brought to the electrophysiology lab in a fasting state.  The patient was adequately sedated with intravenous medications as outlined in the anesthesia report.  The patient's left and right groins were prepped and draped in the usual sterile fashion by the EP lab staff.  Using a percutaneous Seldinger technique, 1 8-French hemostasis sheaths were placed in the right femoral vein, and one 7 French and one 9-French hemostasis sheaths were placed into the left common femoral vein. The right femoral vein sheath site was preclosed using an Abbott Perclose and closed at the end of the case. A [employer]ense [employer]  catheter  was advanced into the atrium to make a right atrial map. Direct ultrasound guidance is used for right and left femoral veins with normal vessel patency. Ultrasound images are captured and stored in the patient's chart. Using ultrasound guidance, the Brockenbrough needle and wire were visualized entering the vessel. Catheter Placement:  A 7-French Biosense [employer] Decapolar coronary sinus catheter was introduced through the right common femoral vein and advanced into the coronary sinus for recording and pacing from this location.  Initial Measurements: The patient presented to the electrophysiology lab in sinus rhythm. her  PR interval measured 164 msec with a QRS duration of 380 msec and a QT interval of 51 msec. Intracardiac Echocardiography: An 8-French [employer]ense [employer] AcuNav intracardiac echocardiography catheter was introduced through the right common femoral vein and advanced into the right atrium. Intracardiac echocardiography was performed of the left atrium, and a three-dimensional anatomical rendering of the left atrium was performed using CARTO sound technology.  The patient was noted to have a moderate sized left atrium.  The interatrial septum was prominent but not aneurysmal. All 4 pulmonary veins were visualized and noted to have separate ostia.  The pulmonary veins were moderate in size.  The left atrial appendage was visualized and did not reveal thrombus.   There was no evidence of pulmonary vein stenosis. Transseptal Puncture: The right common femoral vein sheath was exchanged for one Visigo sheath and transseptal access was achieved in a standard fashion using a [employer]less pigtail wire under fluoroscopy with intracardiac echocardiography confirmation of the transseptal puncture.  Once transseptal access had been achieved, heparin  was administered intravenously and intra- arterially in order to maintain an ACT of greater than 350 seconds throughout the procedure. 3D Mapping and Ablation:  A  Biosense  Webster Vripulse catheter was advanced into the left atrium through the Visigo sheath.   Three-dimensional electroanatomical mapping was performed using CARTO technology.  This demonstrated electrical activity within all four pulmonary veins at baseline.   The patient underwent successful sequential electrical isolation of all four pulmonary veins using pulsed field energy with 17 total pulses delivered.  Entrance and exit block were confirmed. The sheath was then pulled back into the right atrial and positioned along the cavo-tricuspid isthmus.  A Biosense Webster DF 8mm ablation catheter was placed through the sheath into the right atrium. Mapping along the atrial side of the isthmus was performed.  This demonstrated a standard isthmus.  A series of radiofrequency applications were then delivered along the isthmus.  Complete bidirectional cavotricuspid isthmus block was achieved as confirmed by differential atrial pacing from the low lateral right atrium.  A stimulus to earliest atrial activation across the isthmus measured 142 msec bi-directionally.  The patient was observe without return of conduction through the isthmus. Measurements Following Ablation: In sinus rhythm with RR interval was 1102 msec, with PR 191 msec, QRS 92 msec, and QT 429 msec.  Following ablation the AH interval measured 90 msec with an HV interval of 66 msec. Ventricular pacing was performed, which revealed VA dissociation when pacing at 600 msec. Rapid atrial pacing was performed, which revealed an AV Wenckebach cycle length of 380 msec.  Electroisolation was then again confirmed in all four pulmonary veins.  Pacing was performed along the ablation line which confirmed entrance and exit block. The procedure was therefore considered completed.  All catheters were removed, and the sheaths were aspirated and flushed.  40 mg protamine  was given to reverse heparin .  The right femoral vein sheath was  removed and closed using a Perclose device.   The left femoral vein sheaths were closed using the Mynx closure device and the sheaths were removed. The patient was transferred to the recovery area.  Intracardiac echocardiogram revealed no pericardial effusion.  There were no early apparent complications. CONCLUSIONS: 1. Sinus rhythm upon presentation.  2. Successful electrical isolation and anatomical encircling of all four pulmonary veins with pulsed field ablation. 3. Posterior wall isolation using pulse field ablation 4. Cavotricuspid isthmus ablation 5. No early apparent complications. Nikhita Mentzel Gladis Darek Eifler,MD 12:17 PM 12/14/2024    Disposition Pt is being discharged home today in good condition.  Follow-up Plans & Appointments  Discharge Instructions     Call MD for:  redness, tenderness, or signs of infection (pain, swelling, redness, odor or green/yellow discharge around incision site)   Complete by: As directed    Call MD for:  temperature >100.4   Complete by: As directed    Increase activity slowly   Complete by: As directed        Discharge Medications Allergies as of 12/15/2024       Reactions   Trazodone Dermatitis        Medication List     PAUSE taking these medications    olmesartan 20 MG tablet Wait to take this until your doctor or other care provider tells you to start again. Commonly known as: BENICAR Take 20 mg by mouth daily.       TAKE these medications    albuterol  108 (90 Base) MCG/ACT inhaler Commonly known as: VENTOLIN  HFA Inhale 1 puff into the lungs every 6 (six) hours as needed for wheezing or shortness of breath.   ALPRAZolam  0.5 MG tablet Commonly known as: XANAX  Take 0.5 mg by mouth 3 (three) times daily as needed for anxiety or sleep.   atorvastatin  20 MG tablet Commonly known as: LIPITOR Take 20 mg by mouth every evening.   Austedo  XR 24 MG Tb24 Generic drug: Deutetrabenazine  ER Take 24 mg by mouth daily.   Eliquis  5 MG Tabs tablet Generic drug: apixaban  Take 1 tablet (5  mg total) by mouth 2 (two) times daily.   eszopiclone 2 MG Tabs tablet Commonly known as: LUNESTA Take 2 mg by mouth at bedtime.   famotidine  20 MG tablet Commonly known as: PEPCID  Take 20 mg by mouth daily as needed for heartburn or indigestion.   gabapentin  300 MG capsule Commonly known as: NEURONTIN  Take 600 mg by mouth 2 (two) times daily.   hydrOXYzine  25 MG tablet Commonly known as: ATARAX  Take 25 mg by mouth 2 (two) times daily as needed (sleep).   Linzess  145 MCG Caps capsule Generic drug: linaclotide  Take 145 mcg by mouth daily as needed (Stomach isues).   oxyCODONE -acetaminophen  10-325 MG tablet Commonly known as: PERCOCET Take 1 tablet by mouth 4 (four) times daily as needed for pain.   propafenone  225 MG 12 hr capsule Commonly known as: RYTHMOL  SR Take 225 mg by mouth every 12 (twelve) hours.   sertraline  100 MG tablet Commonly known as: ZOLOFT  Take 200 mg by mouth daily. What changed: how much to take   Vraylar  1.5 & 3 MG Cppk Generic drug: Cariprazine  HCl Take 4.5 mg by mouth daily.   Zepbound  5 MG/0.5ML Pen Generic drug: tirzepatide  Inject 5 mg into the skin once a week.   zolpidem  10 MG tablet Commonly known as: AMBIEN  Take 10 mg by mouth at bedtime as needed.  Outstanding Labs/Studies None  Duration of Discharge Encounter: APP Time: 20 minutes   Signed, Leontine LOISE Salen, PA-C 12/15/2024, 10:06 AM    I have seen and examined this patient with Leontine Salen.  Agree with above, note added to reflect my findings.  Patient presented to the hospital for atrial fibrillation ablation.  Ablation was performed yesterday.  Patient is feeling well this morning.  He did stay overnight in the hospital due to no help at home overnight.  Lailanie Hasley plan for discharge with follow-up in A-fib clinic.  GEN: No acute distress.   Neck: No JVD Cardiac: RRR, no murmurs, rubs, or gallops.  Respiratory: normal BS bases bilaterally. GI: Soft, nontender,  non-distended  MS: No edema; No deformity. Neuro:  Nonfocal  Skin: warm and dry Psych: Normal affect    MD time at discharge: 25 minutes  Dilan Novosad M. Caileen Veracruz MD 12/15/2024 10:21 AM  "

## 2024-12-17 ENCOUNTER — Telehealth: Payer: Self-pay | Admitting: Cardiology

## 2024-12-17 NOTE — Telephone Encounter (Signed)
 Spoke with the patient who states that the right side incision is slightly warmer than the left side. She states that she is not having any pain at either site, just some minor tenderness. She denies any redness, swelling, drainage or foul odor. She states that she has not taken her bandage off, which I have advised her to go ahead and remove. She will continue to monitor the sites and call us  back if she has any further concerns or worsening symptoms.

## 2024-12-17 NOTE — Telephone Encounter (Signed)
 Pt states her groin surgical site from heart cath is tender and warm. Please advise.

## 2024-12-17 NOTE — Anesthesia Postprocedure Evaluation (Signed)
"   Anesthesia Post Note  Patient: Michele Townsend  Procedure(s) Performed: ATRIAL FIBRILLATION ABLATION     Patient location during evaluation: PACU Anesthesia Type: General Level of consciousness: awake and alert Pain management: pain level controlled Vital Signs Assessment: post-procedure vital signs reviewed and stable Respiratory status: spontaneous breathing, nonlabored ventilation, respiratory function stable and patient connected to nasal cannula oxygen Cardiovascular status: blood pressure returned to baseline and stable Postop Assessment: no apparent nausea or vomiting Anesthetic complications: no   There were no known notable events for this encounter.              [name]      "

## 2024-12-18 NOTE — Telephone Encounter (Signed)
 Patient is following up. She has concerns with tape coming off and would like a call back to discuss.

## 2024-12-18 NOTE — Telephone Encounter (Signed)
 Spoke with pt who reports she has showered and tape is rolling up on the ends.  She has cut the end off of the tape.  Pt states there is a piece of gauze on each side of her groin and it is the clear tape covering the gauze that is coming off.  Pt advised she was instructed by the EP RN yesterday to remove both bandages. Pt advised again to remove the bandages.  Pt reports there is no warmth in the groin area today.  Pt verbalizes understanding and agrees with current plan.

## 2024-12-19 ENCOUNTER — Encounter: Payer: Self-pay | Admitting: Cardiology

## 2025-01-01 ENCOUNTER — Other Ambulatory Visit: Payer: Self-pay

## 2025-01-10 ENCOUNTER — Ambulatory Visit (HOSPITAL_COMMUNITY): Admitting: Nurse Practitioner

## 2025-01-11 ENCOUNTER — Ambulatory Visit (HOSPITAL_COMMUNITY): Admitting: Nurse Practitioner

## 2025-03-14 ENCOUNTER — Ambulatory Visit (HOSPITAL_COMMUNITY): Admitting: Nurse Practitioner
